# Patient Record
Sex: Male | Born: 1980 | Race: Black or African American | Hispanic: No | Marital: Single | Smoking: Current every day smoker
Health system: Southern US, Community
[De-identification: ages and names within clinical notes are randomized; demographics above are authoritative.]

## PROBLEM LIST (undated history)

## (undated) DIAGNOSIS — M25511 Pain in right shoulder: Secondary | ICD-10-CM

## (undated) DIAGNOSIS — F4310 Post-traumatic stress disorder, unspecified: Secondary | ICD-10-CM

## (undated) DIAGNOSIS — G10 Huntington's disease: Secondary | ICD-10-CM

## (undated) DIAGNOSIS — F32A Depression, unspecified: Secondary | ICD-10-CM

## (undated) DIAGNOSIS — G255 Other chorea: Secondary | ICD-10-CM

## (undated) HISTORY — DX: Depression, unspecified: F32.A

## (undated) HISTORY — PX: OTHER SURGICAL HISTORY: SHX169

## (undated) HISTORY — DX: Pain in right shoulder: M25.511

---

## 2006-08-13 ENCOUNTER — Emergency Department: Payer: Self-pay | Admitting: Emergency Medicine

## 2006-08-15 ENCOUNTER — Emergency Department: Payer: Self-pay | Admitting: Unknown Physician Specialty

## 2010-02-15 ENCOUNTER — Emergency Department: Payer: Self-pay | Admitting: Emergency Medicine

## 2012-08-21 ENCOUNTER — Emergency Department: Payer: Self-pay | Admitting: Emergency Medicine

## 2015-05-09 ENCOUNTER — Encounter: Payer: Self-pay | Admitting: Urgent Care

## 2015-05-09 ENCOUNTER — Emergency Department
Admission: EM | Admit: 2015-05-09 | Discharge: 2015-05-09 | Disposition: A | Discharge status: Home / Self Care | Payer: Self-pay | Attending: Emergency Medicine | Admitting: Emergency Medicine

## 2015-05-09 DIAGNOSIS — H109 Unspecified conjunctivitis: Secondary | ICD-10-CM | POA: Insufficient documentation

## 2015-05-09 DIAGNOSIS — F17200 Nicotine dependence, unspecified, uncomplicated: Secondary | ICD-10-CM | POA: Insufficient documentation

## 2015-05-09 MED ORDER — ERYTHROMYCIN 5 MG/GM OP OINT
TOPICAL_OINTMENT | Freq: Once | OPHTHALMIC | Status: AC
  Administered 2015-05-09: 1 via OPHTHALMIC
  Filled 2015-05-09: qty 1

## 2015-05-09 MED ORDER — LORATADINE 10 MG PO TABS: 10.0000 mg | ORAL_TABLET | Freq: Every day | ORAL | Status: DC

## 2015-05-09 MED ORDER — ERYTHROMYCIN 5 MG/GM OP OINT: TOPICAL_OINTMENT | Freq: Once | OPHTHALMIC | Status: DC

## 2015-05-09 MED ORDER — LORATADINE 10 MG PO TABS
10.0000 mg | ORAL_TABLET | Freq: Once | ORAL | Status: AC
  Administered 2015-05-09: 10 mg via ORAL
  Filled 2015-05-09: qty 1

## 2015-05-09 NOTE — ED Notes (Signed)
Patient presents with 3 weeks of eye swelling, "weeping", and pain. States, "It switches sides. Today it is the RIGHT worse than the other one".

## 2015-05-09 NOTE — ED Provider Notes (Signed)
[facility] Emergency Department Provider Note  ____________________________________________  Time seen: 3:00 AM  I have reviewed the triage vital signs and the nursing notes.   HISTORY  Chief Complaint Eye Problem      HPI Frank Fernandez is a 35 y.o. male presents with right eye drainage swelling and irritation. Patient states that he's had similar symptoms in his left eye and that it alternates back and forth over the past 3 weeks however today the right eye and its worse than usual. Patient denies any fever afebrile on presentation temperature 97.7. Patient does admits to "allergies". Patient does admit to eyelash matting on awakening in the morning    Past medical history "Allergies"  There are no active problems to display for this patient.   Past surgical history None  Current Outpatient Rx  Name  Route  Sig  Dispense  Refill  . erythromycin ophthalmic ointment   Right Eye   Place into the right eye once.   3.5 g   0   . loratadine (CLARITIN) 10 MG tablet   Oral   Take 1 tablet (10 mg total) by mouth daily.   30 tablet   0     Allergies No known drug allergies No family history on file.  Social History Social History  Substance Use Topics  . Smoking status: Current Every Day Smoker  . Smokeless tobacco: None  . Alcohol Use: No    Review of Systems  Constitutional: Negative for fever. Eyes: Negative for visual changes.Positive for right eye discharge and irritation/social ENT: Negative for sore throat. Cardiovascular: Negative for chest pain. Respiratory: Negative for shortness of breath. Gastrointestinal: Negative for abdominal pain, vomiting and diarrhea. Genitourinary: Negative for dysuria. Musculoskeletal: Negative for back pain. Skin: Negative for rash. Neurological: Negative for headaches, focal weakness or numbness.   10-point ROS otherwise  negative.  ____________________________________________   PHYSICAL EXAM:  VITAL SIGNS: ED Triage Vitals  Enc Vitals Group     BP 05/09/15 0121 133/85 mmHg     Pulse Rate 05/09/15 0121 62     Resp 05/09/15 0121 16     Temp 05/09/15 0121 97.7 F (36.5 C)     Temp Source 05/09/15 0121 Oral     SpO2 05/09/15 0121 100 %     Weight 05/09/15 0121 200 lb (90.719 kg)     Height 05/09/15 0121 5\' 11"  (1.803 m)     Head Cir --      Peak Flow --      Pain Score 05/09/15 0122 7     Pain Loc --      Pain Edu? --      Excl. in GC? --      Constitutional: Alert and oriented. Well appearing and in no distress. Eyes: ConjunctivaeInjection with erythema. PERRL. Normal extraocular movements. ENT   Head: Normocephalic and atraumatic.   Nose: No congestion/rhinnorhea.   Mouth/Throat: Mucous membranes are moist.   Neck: No stridor. Hematological/Lymphatic/Immunilogical: No cervical lymphadenopathy. Cardiovascular: Normal rate, regular rhythm. Normal and symmetric distal pulses are present in all extremities. No murmurs, rubs, or gallops. Respiratory: Normal respiratory effort without tachypnea nor retractions. Breath sounds are clear and equal bilaterally. No wheezes/rales/rhonchi. Gastrointestinal: Soft and nontender. No distention. There is no CVA tenderness. Genitourinary: deferred Musculoskeletal: Nontender with normal range of motion in all extremities. No joint effusions.  No lower extremity tenderness nor edema. Neurologic:  Normal speech and language. No gross focal neurologic deficits are appreciated. Speech is normal.  Skin:  Skin is warm, dry and intact. No rash noted. Psychiatric: Mood and affect are normal. Speech and behavior are normal. Patient exhibits appropriate insight and judgment.    INITIAL IMPRESSION / ASSESSMENT AND PLAN / ED COURSE  Pertinent labs & imaging results that were available during my care of the patient were reviewed by me and considered in my  medical decision making (see chart for details).  History of physical exam consistent with possible allergic conjunctivitis with superimposed bacterial conjunctivitis in the right eye. As such patient received erythromycin ophthalmic and Claritin.  ____________________________________________   FINAL CLINICAL IMPRESSION(S) / ED DIAGNOSES  Final diagnoses:  Conjunctivitis of right eye      Darci Current, MD 05/09/15 [phone]

## 2015-05-09 NOTE — Discharge Instructions (Signed)

## 2016-06-08 ENCOUNTER — Encounter: Payer: Self-pay | Admitting: *Deleted

## 2016-06-08 DIAGNOSIS — M545 Low back pain: Secondary | ICD-10-CM | POA: Diagnosis not present

## 2016-06-08 DIAGNOSIS — R1084 Generalized abdominal pain: Secondary | ICD-10-CM | POA: Diagnosis present

## 2016-06-08 DIAGNOSIS — F17200 Nicotine dependence, unspecified, uncomplicated: Secondary | ICD-10-CM | POA: Insufficient documentation

## 2016-06-08 DIAGNOSIS — Z79899 Other long term (current) drug therapy: Secondary | ICD-10-CM | POA: Insufficient documentation

## 2016-06-08 DIAGNOSIS — Y92410 Unspecified street and highway as the place of occurrence of the external cause: Secondary | ICD-10-CM | POA: Diagnosis not present

## 2016-06-08 DIAGNOSIS — M542 Cervicalgia: Secondary | ICD-10-CM | POA: Insufficient documentation
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
## 2016-06-09 ENCOUNTER — Emergency Department: Payer: No Typology Code available for payment source

## 2016-06-09 ENCOUNTER — Emergency Department
Admission: EM | Admit: 2016-06-09 | Discharge: 2016-06-09 | Disposition: A | Discharge status: Home / Self Care | Payer: No Typology Code available for payment source | Attending: Emergency Medicine | Admitting: Emergency Medicine

## 2016-06-09 DIAGNOSIS — M545 Low back pain, unspecified: Secondary | ICD-10-CM

## 2016-06-09 DIAGNOSIS — M7918 Myalgia, other site: Secondary | ICD-10-CM

## 2016-06-09 DIAGNOSIS — R1084 Generalized abdominal pain: Secondary | ICD-10-CM

## 2016-06-09 DIAGNOSIS — M542 Cervicalgia: Secondary | ICD-10-CM

## 2016-06-09 LAB — BASIC METABOLIC PANEL
ANION GAP: 6 (ref 5–15)
BUN: 27 mg/dL — ABNORMAL HIGH (ref 6–20)
CALCIUM: 8.8 mg/dL — AB (ref 8.9–10.3)
CO2: 31 mmol/L (ref 22–32)
Chloride: 107 mmol/L (ref 101–111)
Creatinine, Ser: 1.18 mg/dL (ref 0.61–1.24)
GLUCOSE: 90 mg/dL (ref 65–99)
POTASSIUM: 4.2 mmol/L (ref 3.5–5.1)
Sodium: 144 mmol/L (ref 135–145)

## 2016-06-09 LAB — CBC
HEMATOCRIT: 46.5 % (ref 40.0–52.0)
HEMOGLOBIN: 15.7 g/dL (ref 13.0–18.0)
MCH: 31.1 pg (ref 26.0–34.0)
MCHC: 33.6 g/dL (ref 32.0–36.0)
MCV: 92.3 fL (ref 80.0–100.0)
Platelets: 207 10*3/uL (ref 150–440)
RBC: 5.04 MIL/uL (ref 4.40–5.90)
RDW: 13.7 % (ref 11.5–14.5)
WBC: 10.3 10*3/uL (ref 3.8–10.6)

## 2016-06-09 MED ORDER — IBUPROFEN 600 MG PO TABS
600.0000 mg | ORAL_TABLET | Freq: Once | ORAL | Status: AC
  Administered 2016-06-09: 600 mg via ORAL
  Filled 2016-06-09: qty 1

## 2016-06-09 MED ORDER — IOPAMIDOL (ISOVUE-300) INJECTION 61%
100.0000 mL | Freq: Once | INTRAVENOUS | Status: AC | PRN
  Administered 2016-06-09: 100 mL via INTRAVENOUS

## 2016-06-09 MED ORDER — TRAMADOL HCL 50 MG PO TABS
50.0000 mg | ORAL_TABLET | Freq: Once | ORAL | Status: AC
  Administered 2016-06-09: 50 mg via ORAL
  Filled 2016-06-09: qty 1

## 2016-06-09 MED ORDER — TRAMADOL HCL 50 MG PO TABS: 50.0000 mg | ORAL_TABLET | Freq: Four times a day (QID) | ORAL | 0 refills | Status: DC | PRN

## 2016-06-09 MED ORDER — ETODOLAC 200 MG PO CAPS: 200.0000 mg | ORAL_CAPSULE | Freq: Three times a day (TID) | ORAL | 0 refills | Status: DC

## 2016-06-09 NOTE — ED Notes (Signed)
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
## 2016-06-09 NOTE — ED Notes (Signed)
No changes in condition, pt resting in chair

## 2016-06-09 NOTE — Discharge Instructions (Signed)
Please follow up

## 2016-08-24 ENCOUNTER — Encounter: Payer: Self-pay | Admitting: Emergency Medicine

## 2016-08-24 ENCOUNTER — Emergency Department
Admission: EM | Admit: 2016-08-24 | Discharge: 2016-08-24 | Disposition: A | Discharge status: Home / Self Care | Payer: Self-pay | Attending: Emergency Medicine | Admitting: Emergency Medicine

## 2016-08-24 DIAGNOSIS — Y998 Other external cause status: Secondary | ICD-10-CM | POA: Insufficient documentation

## 2016-08-24 DIAGNOSIS — S91115A Laceration without foreign body of left lesser toe(s) without damage to nail, initial encounter: Secondary | ICD-10-CM

## 2016-08-24 DIAGNOSIS — F17200 Nicotine dependence, unspecified, uncomplicated: Secondary | ICD-10-CM | POA: Insufficient documentation

## 2016-08-24 DIAGNOSIS — W231XXA Caught, crushed, jammed, or pinched between stationary objects, initial encounter: Secondary | ICD-10-CM | POA: Insufficient documentation

## 2016-08-24 DIAGNOSIS — Y9301 Activity, walking, marching and hiking: Secondary | ICD-10-CM | POA: Insufficient documentation

## 2016-08-24 DIAGNOSIS — Y929 Unspecified place or not applicable: Secondary | ICD-10-CM | POA: Insufficient documentation

## 2016-08-24 DIAGNOSIS — S91215A Laceration without foreign body of left lesser toe(s) with damage to nail, initial encounter: Secondary | ICD-10-CM | POA: Insufficient documentation

## 2016-08-24 MED ORDER — LIDOCAINE HCL (PF) 1 % IJ SOLN
5.0000 mL | Freq: Once | INTRAMUSCULAR | Status: AC
  Administered 2016-08-24: 5 mL
  Filled 2016-08-24: qty 5

## 2016-08-24 NOTE — ED Notes (Signed)
See triage note   States he caught his left 5 th toe on the carpet this am  Laceration noted to toe

## 2016-08-24 NOTE — ED Triage Notes (Signed)
Pt with left fifth toe laceration this am.

## 2016-08-24 NOTE — ED Provider Notes (Signed)
[facility] Emergency Department Provider Note   ____________________________________________   First MD Initiated Contact with Patient 08/24/16 0818     (approximate)  I have reviewed the triage vital signs and the nursing notes.   HISTORY  Chief Complaint Laceration   HPI Frank Fernandez is a 36 y.o. male is here with laceration to his left fifth toe that happened this morning. Patient states that he caught his toe nail in the carpet. He denies any foreign body in the carpet. He states that he has had a tetanus booster within the last 5 years. He rates his pain as an 8 out of 10.   History reviewed. No pertinent past medical history.  There are no active problems to display for this patient.   History reviewed. No pertinent surgical history.  Prior to Admission medications   Not on File    Allergies Patient has no known allergies.  No family history on file.  Social History Social History  Substance Use Topics  . Smoking status: Current Every Day Smoker  . Smokeless tobacco: Never Used  . Alcohol use No    Review of Systems Constitutional: No fever/chills Cardiovascular: Denies chest pain. Respiratory: Denies shortness of breath. Musculoskeletal: Negative for back pain. Skin: Positive for laceration left fifth toe. Neurological: Negative for  focal weakness or numbness.   ____________________________________________   PHYSICAL EXAM:  VITAL SIGNS: ED Triage Vitals  Enc Vitals Group     BP 08/24/16 0809 126/69     Pulse Rate 08/24/16 0809 77     Resp 08/24/16 0809 20     Temp 08/24/16 0809 97.6 F (36.4 C)     Temp Source 08/24/16 0809 Oral     SpO2 08/24/16 0809 99 %     Weight 08/24/16 0807 195 lb (88.5 kg)     Height --      Head Circumference --      Peak Flow --      Pain Score 08/24/16 0807 8     Pain Loc --      Pain Edu? --      Excl. in GC? --     Constitutional: Alert and oriented. Well appearing and  in no acute distress. Eyes: Conjunctivae are normal.  Head: Atraumatic. Nose: No congestion/rhinnorhea. Neck: No stridor.   Cardiovascular: Normal rate, regular rhythm. Grossly normal heart sounds.  Good peripheral circulation. Respiratory: Normal respiratory effort.  No retractions. Lungs CTAB. Musculoskeletal: On examination of the left fifth toe there is no gross deformity. Motor sensory function intact. There is a laceration present on the medial aspect of the fifth toe extending from the base of the toenail to the medial aspect. Laceration is superficial. No foreign bodies were noted. Minimal bleeding present. Neurologic:  Normal speech and language. No gross focal neurologic deficits are appreciated.  Skin:  Skin is warm, dry. Psychiatric: Mood and affect are normal. Speech and behavior are normal.  ____________________________________________   LABS (all labs ordered are listed, but only abnormal results are displayed)  Labs Reviewed - No data to display  PROCEDURES  Procedure(s) performed: LACERATION REPAIR Performed by: Tommi Rumpshonda L Kemal Amores Authorized by: Tommi Rumpshonda L Tyran Huser Consent: Verbal consent obtained. Risks and benefits: risks, benefits and alternatives were discussed Consent given by: patient Patient identity confirmed: provided demographic data Prepped and Draped in normal sterile fashion Wound explored  Laceration Location: Left fifth toe.  Laceration Length: 1.5 cm  No Foreign Bodies seen or palpated  Anesthesia: Digital block  Local anesthetic: lidocaine 1 % without epinephrine  Anesthetic total: 3 ml  Irrigation method: syringe Amount of cleaning: standard  Skin closure: 4-0 Ethilon   Number of sutures: 6   Technique: Simple interrupted   Patient tolerance: Patient tolerated the procedure well with no immediate complications.  Procedures  Critical Care performed: No  ____________________________________________   INITIAL IMPRESSION /  ASSESSMENT AND PLAN / ED COURSE  Pertinent labs & imaging results that were available during my care of the patient were reviewed by me and considered in my medical decision making (see chart for details).  Patient was given information to keep area clean and dry. He is to watch for any signs of infection and clean with mild soap and water daily. He is to take Tylenol or ibuprofen as needed for pain. He can follow up with his PCP or urgent care for suture removal in 10 days.    ___________________________________________   FINAL CLINICAL IMPRESSION(S) / ED DIAGNOSES  Final diagnoses:  Laceration of fifth toe of left foot, initial encounter      NEW MEDICATIONS STARTED DURING THIS VISIT:  Discharge Medication List as of 08/24/2016  8:55 AM       Note:  This document was prepared using Dragon voice recognition software and may include unintentional dictation errors.    Tommi RumpsSummers, Paislee Szatkowski L, PA-C 08/24/16 1123    Jeanmarie PlantMcShane, James A, MD 08/24/16 [phone]

## 2016-08-24 NOTE — Discharge Instructions (Signed)
Keep area clean and dry. Clean daily with mild soap and water and dried completely. Tylenol or ibuprofen as needed for pain. Sutures to be removed at an urgent care or your primary care doctor in 10 days.

## 2016-08-24 NOTE — ED Notes (Signed)
First Nurse Note:  Ice in glove to left small toe.

## 2017-04-24 ENCOUNTER — Emergency Department
Admission: EM | Admit: 2017-04-24 | Discharge: 2017-04-24 | Payer: Self-pay | Attending: Emergency Medicine | Admitting: Emergency Medicine

## 2017-04-24 ENCOUNTER — Emergency Department: Payer: Self-pay

## 2017-04-24 ENCOUNTER — Other Ambulatory Visit: Payer: Self-pay

## 2017-04-24 DIAGNOSIS — T148XXA Other injury of unspecified body region, initial encounter: Secondary | ICD-10-CM | POA: Insufficient documentation

## 2017-04-24 DIAGNOSIS — Z23 Encounter for immunization: Secondary | ICD-10-CM | POA: Insufficient documentation

## 2017-04-24 DIAGNOSIS — Y929 Unspecified place or not applicable: Secondary | ICD-10-CM | POA: Insufficient documentation

## 2017-04-24 DIAGNOSIS — F17200 Nicotine dependence, unspecified, uncomplicated: Secondary | ICD-10-CM | POA: Insufficient documentation

## 2017-04-24 DIAGNOSIS — Z008 Encounter for other general examination: Secondary | ICD-10-CM | POA: Insufficient documentation

## 2017-04-24 DIAGNOSIS — Y9389 Activity, other specified: Secondary | ICD-10-CM | POA: Insufficient documentation

## 2017-04-24 DIAGNOSIS — Y999 Unspecified external cause status: Secondary | ICD-10-CM | POA: Insufficient documentation

## 2017-04-24 DIAGNOSIS — S6982XA Other specified injuries of left wrist, hand and finger(s), initial encounter: Secondary | ICD-10-CM | POA: Insufficient documentation

## 2017-04-24 MED ORDER — IBUPROFEN 600 MG PO TABS
600.0000 mg | ORAL_TABLET | Freq: Once | ORAL | Status: AC
  Administered 2017-04-24: 600 mg via ORAL
  Filled 2017-04-24: qty 1

## 2017-04-24 MED ORDER — HYDROCODONE-ACETAMINOPHEN 5-325 MG PO TABS
1.0000 | ORAL_TABLET | Freq: Once | ORAL | Status: AC
  Administered 2017-04-24: 1 via ORAL
  Filled 2017-04-24: qty 1

## 2017-04-24 MED ORDER — TETANUS-DIPHTH-ACELL PERTUSSIS 5-2.5-18.5 LF-MCG/0.5 IM SUSP
0.5000 mL | Freq: Once | INTRAMUSCULAR | Status: AC
  Administered 2017-04-24: 0.5 mL via INTRAMUSCULAR
  Filled 2017-04-24: qty 0.5
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
## 2017-04-24 NOTE — Discharge Instructions (Signed)
FORTUNATELY TODAY YOUR XRAYS SHOWED NO FRACTURE AND YOU ARE MEDICALLY STABLE FOR BOOKING
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
## 2018-04-28 ENCOUNTER — Emergency Department
Admission: EM | Admit: 2018-04-28 | Discharge: 2018-04-28 | Disposition: A | Discharge status: Home / Self Care | Payer: Self-pay | Attending: Emergency Medicine | Admitting: Emergency Medicine

## 2018-04-28 ENCOUNTER — Encounter: Payer: Self-pay | Admitting: Emergency Medicine

## 2018-04-28 ENCOUNTER — Other Ambulatory Visit: Payer: Self-pay

## 2018-04-28 DIAGNOSIS — K047 Periapical abscess without sinus: Secondary | ICD-10-CM

## 2018-04-28 DIAGNOSIS — F17210 Nicotine dependence, cigarettes, uncomplicated: Secondary | ICD-10-CM | POA: Insufficient documentation

## 2018-04-28 DIAGNOSIS — K0889 Other specified disorders of teeth and supporting structures: Secondary | ICD-10-CM | POA: Insufficient documentation

## 2018-04-28 MED ORDER — IBUPROFEN 600 MG PO TABS: 600.0000 mg | ORAL_TABLET | Freq: Four times a day (QID) | ORAL | 0 refills | Status: DC | PRN

## 2018-04-28 MED ORDER — CEFTRIAXONE SODIUM 1 G IJ SOLR
1.0000 g | Freq: Once | INTRAMUSCULAR | Status: AC
Start: 2018-04-28 — End: 2018-04-28
  Administered 2018-04-28: 1 g via INTRAMUSCULAR
  Filled 2018-04-28: qty 10

## 2018-04-28 MED ORDER — AMOXICILLIN 500 MG PO CAPS
500.0000 mg | ORAL_CAPSULE | Freq: Three times a day (TID) | ORAL | 0 refills | Status: DC
Start: 2018-04-28 — End: 2019-02-06

## 2018-04-28 MED ORDER — LIDOCAINE VISCOUS HCL 2 % MT SOLN
15.0000 mL | Freq: Once | OROMUCOSAL | Status: AC
  Administered 2018-04-28: 10:00:00 15 mL via OROMUCOSAL
  Filled 2018-04-28: qty 15

## 2018-04-28 MED ORDER — LIDOCAINE VISCOUS HCL 2 % MT SOLN: 10.0000 mL | OROMUCOSAL | 0 refills | Status: DC | PRN

## 2018-04-28 NOTE — Discharge Instructions (Addendum)
OPTIONS FOR DENTAL FOLLOW UP CARE ° °[place] Department of Health and Human Services - Local Safety Net Dental Clinics °http://www.ncdhhs.gov/dph/oralhealth/services/safetynetclinics.htm °  °[facility] (336-562-3123) ° °[facility] [place] ([phone]) ° °[facility] ([phone] ext [phone]) ° °[facility] ([phone]) ° °[facility] ([phone]) °This clinic caters to the indigent population and is on a lottery system. °Location: °[facility], [address], [place] °Clinic Hours: °Wednesdays from 6pm - 9pm, patients seen by a lottery system. °For dates, call or go to [email or web address]/[facility]/patients/[email or web address] °Services: °Cleanings, fillings and simple extractions. °Payment Options: °DENTAL WORK IS FREE OF CHARGE. Bring proof of income or support. °Best way to get seen: °Arrive at 5:15 pm - this is a lottery, NOT first come/first serve, so arriving earlier will not increase your chances of being seen. °  °  °[facility] °[phone] °Select option 1 for emergencies °  °Location: °[facility], [address], [place] °Clinic Hours: °No walk-ins accepted - call the day before to schedule an appointment. °Check in times are 9:30 am and 1:30 pm. °Services: °Simple extractions, temporary fillings, pulpectomy/pulp debridement, uncomplicated abscess drainage. °Payment Options: °PAYMENT IS DUE AT THE TIME OF SERVICE.  Fee is usually $100-200, additional surgical procedures (e.g. abscess drainage) may be extra. °Cash, checks, Visa/MasterCard accepted.  Can file Medicaid if patient is covered for dental - patient should call case worker to check. °No discount for [facility] patients. °Best way to get seen: °MUST call the day before and get onto the schedule. Can usually be seen the next 1-2 days. No walk-ins accepted. °  °  °[facility] °[phone] °   °Location: °[facility], [address], [place] °Clinic Hours: °M, W, Th, F 8am or 1:30pm, Tues 9a or 1:30 - first come/first served. °Services: °Simple extractions, temporary fillings, uncomplicated abscess drainage.  You do not need to be an [place] resident. °Payment Options: °PAYMENT IS DUE AT THE TIME OF SERVICE. °Dental insurance, otherwise sliding scale - bring proof of income or support. °Depending on income and treatment needed, cost is usually $50-200. °Best way to get seen: °Arrive early as it is first come/first served. °  °  °[facility] °[phone] °  °Location: °[address] °Clinic Hours: °Mon-Thu 8a-5p °Services: °Most basic dental services including extractions and fillings. °Payment Options: °PAYMENT IS DUE AT THE TIME OF SERVICE. °Sliding scale, up to 50% off - bring proof if income or support. °Medicaid with dental option accepted. °Best way to get seen: °Call to schedule an appointment, can usually be seen within 2 weeks OR they will try to see walk-ins - show up at 8a or 2p (you may have to wait). °  °  °[facility] °[phone] °[place] RESIDENTS ONLY °  °Location: °[facility], [address], [place], [place] [address] °Clinic Hours: By appointment only. °Monday - Thursday 8am-5pm, Friday 8am-12pm °Services: Cleanings, fillings, extractions. °Payment Options: °PAYMENT IS DUE AT THE TIME OF SERVICE. °Cash, Visa or MasterCard. Sliding scale - $30 minimum per service. °Best way to get seen: °Come in to office, complete packet and make an appointment - need proof of income °or support monies for each household member and proof of [place] residence. °Usually takes about a month to get in. °  °  °[facility] °[phone] °  °Location: °[address]   [place] °Clinic Hours: Walk-in Urgent Care Dental Services are offered Monday-Friday  mornings only. °The numbers of emergencies accepted daily is limited to the number of °providers available. °Maximum 15 - Mondays, Wednesdays & Thursdays °Maximum 10 - Tuesdays & Fridays °Services: °You do not need to be a [place] resident to be seen for a dental emergency. °Emergencies are defined as pain, swelling, abnormal bleeding, or dental trauma. Walkins will receive x-rays if needed. °NOTE: Dental cleaning is not an emergency. °Payment Options: °PAYMENT IS DUE AT THE TIME OF SERVICE. °Minimum co-pay is $40.00 for uninsured patients. °Minimum co-pay is $3.00 for Medicaid with dental coverage. °Dental Insurance is accepted and must be presented at time of visit. °Medicare does not cover dental. °Forms of payment: Cash, credit card, checks. °Best way to get seen: °If not previously registered with the clinic, walk-in dental registration begins at 7:15 am and is on a first come/first serve basis. °If previously registered with the clinic, call to make an appointment. °  °  °[facility] °[phone] °[place] RESIDENTS ONLY °  °Location: °[address], [place], [place] °Clinic Hours: °Mon-Thu 10a-2p °Services: Extractions only! °Payment Options: °FREE (donations accepted) - bring proof of income or support °Best way to get seen: °Call and schedule an appointment OR come at 8am on the 1st Monday of every month (except for holidays) when it is first come/first served. °  °  °[facility] °[phone] °  °Location: °[address], [place] °Clinic Hours: °Friday mornings °Services, Payment Options, Best way to get seen: °Call for info °

## 2018-04-28 NOTE — ED Notes (Signed)
See triage note  Presents with pain and swelling to left jaw line  Denies any injury yo area  And states he has not broken a tooth

## 2018-04-28 NOTE — ED Triage Notes (Signed)
Pt has right jaw swelling and dental pain.  Temp 102 per pt this AM, has not taken motrin/tylenol.  No cough, no travel.

## 2018-04-28 NOTE — ED Provider Notes (Signed)
[facility] Emergency Department Provider Note  ____________________________________________  Time seen: Approximately 9:44 AM  I have reviewed the triage vital signs and the nursing notes.   HISTORY  Chief Complaint Dental Problem    HPI Frank Fernandez is a 38 y.o. male that presents to the emergency department for evaluation of left-sided dental pain for 3 days.  Patient states that area started swelling yesterday.  He had a temperature of 102 this morning.  He did not take any Tylenol or Motrin.  No sick contacts.  No recent travel.  He does not have a dentist.   History reviewed. No pertinent past medical history.  There are no active problems to display for this patient.   History reviewed. No pertinent surgical history.  Prior to Admission medications   Medication Sig Start Date End Date Taking? Authorizing Provider  amoxicillin (AMOXIL) 500 MG capsule Take 1 capsule (500 mg total) by mouth 3 (three) times daily. 04/28/18   [name] Derry, PA-C  ibuprofen (ADVIL,MOTRIN) 600 MG tablet Take 1 tablet (600 mg total) by mouth every 6 (six) hours as needed. 04/28/18   [name] Derry, PA-C  lidocaine (XYLOCAINE) 2 % solution Use as directed 10 mLs in the mouth or throat as needed. 04/28/18   [name] Derry, PA-C    Allergies Patient has no known allergies.  History reviewed. No pertinent family history.  Social History Social History   Tobacco Use  . Smoking status: Current Every Day Smoker  . Smokeless tobacco: Never Used  Substance Use Topics  . Alcohol use: No  . Drug use: No     Review of Systems  Constitutional: Positive for fever. Respiratory: No SOB. Gastrointestinal: No abdominal pain.  No nausea, no vomiting.  Musculoskeletal: Negative for musculoskeletal pain. Skin: Negative for rash, abrasions, lacerations, ecchymosis. Neurological: Negative for headaches, numbness or  tingling   ____________________________________________   PHYSICAL EXAM:  VITAL SIGNS: ED Triage Vitals  Enc Vitals Group     BP 04/28/18 0934 125/74     Pulse Rate 04/28/18 0934 89     Resp 04/28/18 0934 16     Temp 04/28/18 0934 98.2 F (36.8 C)     Temp Source 04/28/18 0934 Oral     SpO2 04/28/18 0934 98 %     Weight 04/28/18 0931 185 lb (83.9 kg)     Height 04/28/18 0931 5\' 11"  (1.803 m)     Head Circumference --      Peak Flow --      Pain Score 04/28/18 0931 9     Pain Loc --      Pain Edu? --      Excl. in [place]? --      Constitutional: Alert and oriented. Well appearing and in no acute distress. Eyes: Conjunctivae are normal. PERRL. EOMI. Head: Atraumatic. ENT:      Ears:      Nose: No congestion/rhinnorhea.      Mouth/Throat: Mucous membranes are moist.  Large cavity decayed to gumline to tooth #19.  Mild swelling to left cheek.  Full range of motion of jaw. Neck: No stridor.  Cardiovascular: Normal rate, regular rhythm.  Good peripheral circulation. Respiratory: Normal respiratory effort without tachypnea or retractions. Lungs CTAB. Good air entry to the bases with no decreased or absent breath sounds. Musculoskeletal: Full range of motion to all extremities. No gross deformities appreciated. Neurologic:  Normal speech and language. No gross focal neurologic deficits are appreciated.  Skin:  Skin is warm, dry  and intact. No rash noted. Psychiatric: Mood and affect are normal. Speech and behavior are normal. Patient exhibits appropriate insight and judgement.   ____________________________________________   LABS (all labs ordered are listed, but only abnormal results are displayed)  Labs Reviewed - No data to display ____________________________________________  EKG   ____________________________________________  RADIOLOGY   No results found.  ____________________________________________    PROCEDURES  Procedure(s) performed:     Procedures    Medications  cefTRIAXone (ROCEPHIN) injection 1 g (1 g Intramuscular Given 04/28/18 0953)  lidocaine (XYLOCAINE) 2 % viscous mouth solution 15 mL (15 mLs Mouth/Throat Given 04/28/18 0953)     ____________________________________________   INITIAL IMPRESSION / ASSESSMENT AND PLAN / ED COURSE  Pertinent labs & imaging results that were available during my care of the patient were reviewed by me and considered in my medical decision making (see chart for details).  Review of the [place] CSRS was performed in accordance of the [facility] prior to dispensing any controlled drugs.     Patient's diagnosis is consistent with dental abscess.  Vital signs and exam are reassuring.  IM ceftriaxone was given.  Patient will be discharged home with prescriptions for amoxicillin, viscous lidocaine, ibuprofen. Patient is to follow up with dentist as directed.  Dental resources were given.  Patient is given ED precautions to return to the ED for any worsening or new symptoms.     ____________________________________________  FINAL CLINICAL IMPRESSION(S) / ED DIAGNOSES  Final diagnoses:  Dental abscess      NEW MEDICATIONS STARTED DURING THIS VISIT:  ED Discharge Orders         Ordered    amoxicillin (AMOXIL) 500 MG capsule  3 times daily     04/28/18 0948    lidocaine (XYLOCAINE) 2 % solution  As needed     04/28/18 0948    ibuprofen (ADVIL,MOTRIN) 600 MG tablet  Every 6 hours PRN     04/28/18 0948              This chart was dictated using voice recognition software/Dragon. Despite best efforts to proofread, errors can occur which can change the meaning. Any change was purely unintentional.    [name] Derry, PA-C 04/28/18 1706    Don Perking, [place], MD 04/29/18 [phone]

## 2019-02-06 ENCOUNTER — Other Ambulatory Visit: Payer: Self-pay

## 2019-02-06 ENCOUNTER — Emergency Department
Admission: EM | Admit: 2019-02-06 | Discharge: 2019-02-06 | Disposition: A | Discharge status: Home / Self Care | Payer: HRSA Program | Attending: Emergency Medicine | Admitting: Emergency Medicine

## 2019-02-06 DIAGNOSIS — R05 Cough: Secondary | ICD-10-CM | POA: Insufficient documentation

## 2019-02-06 DIAGNOSIS — Z20822 Contact with and (suspected) exposure to covid-19: Secondary | ICD-10-CM | POA: Diagnosis not present

## 2019-02-06 DIAGNOSIS — R059 Cough, unspecified: Secondary | ICD-10-CM

## 2019-02-06 DIAGNOSIS — F17210 Nicotine dependence, cigarettes, uncomplicated: Secondary | ICD-10-CM | POA: Diagnosis not present

## 2019-02-06 LAB — SARS CORONAVIRUS 2 (TAT 6-24 HRS): SARS Coronavirus 2: NEGATIVE

## 2019-02-06 NOTE — ED Triage Notes (Signed)
Pt states "im aroudn infants and I just need a paper saying I dont have covid".

## 2019-02-06 NOTE — ED Notes (Addendum)
Pt with cough x 2 months. states he needs covid test. Lungs clear. NAD

## 2019-02-06 NOTE — ED Provider Notes (Signed)
[facility] Emergency Department Provider Note  ____________________________________________   First MD Initiated Contact with Patient 02/06/19 1408     (approximate)  I have reviewed the triage vital signs and the nursing notes.   HISTORY  Chief Complaint URI   HPI Frank Fernandez is a 39 y.o. male presents to the ED for a Covid test.  Patient states he has had a nonproductive cough for 2 months with no fever, chills, nausea or vomiting.  He has had no difficulty breathing.  States that he just needs a note saying he does not have Covid because he is around infants.  He denies any change in taste or smell.  Patient continue to smoke 5 to 7 cigarettes/day.  He rates his pain a 0/10.      History reviewed. No pertinent past medical history.  There are no problems to display for this patient.   History reviewed. No pertinent surgical history.  Prior to Admission medications   Not on File    Allergies Patient has no known allergies.  No family history on file.  Social History Social History   Tobacco Use  . Smoking status: Current Every Day Smoker  . Smokeless tobacco: Never Used  Substance Use Topics  . Alcohol use: No  . Drug use: No    Review of Systems Constitutional: No fever/chills Eyes: No visual changes. ENT: No sore throat. Cardiovascular: Denies chest pain. Respiratory: Denies shortness of breath.  Positive for cough. Gastrointestinal: No abdominal pain.  No nausea, no vomiting.  No diarrhea.  Musculoskeletal: Negative for muscle aches. Skin: Negative for rash. Neurological: Negative for headaches, focal weakness or numbness. ____________________________________________   PHYSICAL EXAM:  VITAL SIGNS: ED Triage Vitals  Enc Vitals Group     BP 02/06/19 1327 (!) 126/95     Pulse Rate 02/06/19 1327 81     Resp 02/06/19 1327 20     Temp 02/06/19 1327 98.7 F (37.1 C)     Temp Source 02/06/19 1327 Oral     SpO2  02/06/19 1327 99 %     Weight 02/06/19 1328 181 lb (82.1 kg)     Height 02/06/19 1328 6' (1.829 m)     Head Circumference --      Peak Flow --      Pain Score 02/06/19 1336 0     Pain Loc --      Pain Edu? --      Excl. in [place]? --     Constitutional: Alert and oriented. Well appearing and in no acute distress. Eyes: Conjunctivae are normal.  Head: Atraumatic. Neck: No stridor.   Cardiovascular: Normal rate, regular rhythm. Grossly normal heart sounds.  Good peripheral circulation. Respiratory: Normal respiratory effort.  No retractions. Lungs CTAB. Gastrointestinal: Soft and nontender. No distention. No abdominal bruits. No CVA tenderness. Musculoskeletal: Moves upper and lower extremities any difficulty.  Normal gait was noted. Neurologic:  Normal speech and language. No gross focal neurologic deficits are appreciated. No gait instability. Skin:  Skin is warm, dry and intact.  Psychiatric: Mood and affect are normal. Speech and behavior are normal.  ____________________________________________   LABS (all labs ordered are listed, but only abnormal results are displayed)  Labs Reviewed  SARS CORONAVIRUS 2 (TAT 6-24 HRS)    PROCEDURES  Procedure(s) performed (including Critical Care):  Procedures ____________________________________________   INITIAL IMPRESSION / ASSESSMENT AND PLAN / ED COURSE  As part of my medical decision making, I reviewed the following data within  the electronic MEDICAL RECORD NUMBER Notes from prior ED visits and  Controlled Substance Database  Frank Fernandez was evaluated in Emergency Department on 02/06/2019 for the symptoms described in the history of present illness. He was evaluated in the context of the global COVID-19 pandemic, which necessitated consideration that the patient might be at risk for infection with the SARS-CoV-2 virus that causes COVID-19. Institutional protocols and algorithms that pertain to the evaluation of patients at risk for  COVID-19 are in a state of rapid change based on information released by regulatory bodies including the CDC and federal and state organizations. These policies and algorithms were followed during the patient's care in the ED.  39 year old male presents to the ED for a Covid test.  He states that his symptoms have been going on for 2 months but he denies any fever, chills, nausea or vomiting.  He continues to eat without restrictions.  Patient states he needs paper stating that he does not have Covid so that he can be around infants.  Patient is aware that the results will not be available for 24 hours.   ____________________________________________   FINAL CLINICAL IMPRESSION(S) / ED DIAGNOSES  Final diagnoses:  Cough  Cigarette smoker     ED Discharge Orders    None       Note:  This document was prepared using Dragon voice recognition software and may include unintentional dictation errors.    Tommi Rumps, PA-C 02/06/19 1511    Chesley Noon, MD 02/07/19 1549

## 2019-02-06 NOTE — Discharge Instructions (Signed)
Follow-up with your primary care provider or can no clinic acute care if any continued problems.  Increase fluids.  Decrease the amount of smoking or quit if at all possible.  You may take Tylenol if needed.  The results of your Covid test should be available to you in 24 hours.  Your oxygen level at this time is 99% and you do not have a fever.

## 2019-02-06 NOTE — ED Notes (Signed)
Pt c/o weakness and cough. This tech informed pt that I would do an EKG and blood work d/t feeling weak. Pt stated "I just want to be tested for COVID. I just need paperwork saying that I an negative." Explained to pt that if he is tested here that it would not result today. Pt continues to ask if he will still get paperwork today saying he is negative.

## 2019-09-10 ENCOUNTER — Ambulatory Visit: Payer: Medicaid Other | Attending: Internal Medicine

## 2019-09-10 DIAGNOSIS — Z23 Encounter for immunization: Secondary | ICD-10-CM

## 2019-09-10 NOTE — Progress Notes (Signed)
   Covid-19 Vaccination Clinic  Name:  Frank Fernandez    MRN: [id number] DOB: 04/30/1980  09/10/2019  Frank Fernandez was observed post Covid-19 immunization for 15 minutes without incident. He was provided with Vaccine Information Sheet and instruction to access the V-Safe system.   Frank Fernandez was instructed to call 911 with any severe reactions post vaccine: Marland Kitchen Difficulty breathing  . Swelling of face and throat  . A fast heartbeat  . A bad rash all over body  . Dizziness and weakness   Immunizations Administered    Name Date Dose VIS Date Route   Pfizer COVID-19 Vaccine 09/10/2019  2:57 PM 0.3 mL 03/21/2018 Intramuscular   Manufacturer: [employer], [employer]   Lot: [id number]   NDC: [address]-9

## 2019-10-08 ENCOUNTER — Ambulatory Visit: Payer: Self-pay

## 2019-11-28 ENCOUNTER — Emergency Department: Payer: Medicaid Other

## 2019-11-28 ENCOUNTER — Other Ambulatory Visit: Payer: Self-pay

## 2019-11-28 ENCOUNTER — Emergency Department
Admission: EM | Admit: 2019-11-28 | Discharge: 2019-11-28 | Disposition: A | Discharge status: Home / Self Care | Payer: Medicaid Other | Attending: Emergency Medicine | Admitting: Emergency Medicine

## 2019-11-28 DIAGNOSIS — F17200 Nicotine dependence, unspecified, uncomplicated: Secondary | ICD-10-CM | POA: Insufficient documentation

## 2019-11-28 DIAGNOSIS — M79661 Pain in right lower leg: Secondary | ICD-10-CM | POA: Insufficient documentation

## 2019-11-28 DIAGNOSIS — M79605 Pain in left leg: Secondary | ICD-10-CM

## 2019-11-28 DIAGNOSIS — M79662 Pain in left lower leg: Secondary | ICD-10-CM | POA: Insufficient documentation

## 2019-11-28 LAB — BASIC METABOLIC PANEL
Anion gap: 8 (ref 5–15)
BUN: 23 mg/dL — ABNORMAL HIGH (ref 6–20)
CO2: 25 mmol/L (ref 22–32)
Calcium: 8.3 mg/dL — ABNORMAL LOW (ref 8.9–10.3)
Chloride: 106 mmol/L (ref 98–111)
Creatinine, Ser: 0.99 mg/dL (ref 0.61–1.24)
GFR, Estimated: >60 mL/min (ref >60)
Glucose, Bld: 100 mg/dL — ABNORMAL HIGH (ref 70–99)
Potassium: 4 mmol/L (ref 3.5–5.1)
Sodium: 139 mmol/L (ref 135–145)

## 2019-11-28 LAB — CBC
HCT: 41 % (ref 39.0–52.0)
Hemoglobin: 13.6 g/dL (ref 13.0–17.0)
MCH: 31.1 pg (ref 26.0–34.0)
MCHC: 33.2 g/dL (ref 30.0–36.0)
MCV: 93.8 fL (ref 80.0–100.0)
Platelets: 214 10*3/uL (ref 150–400)
RBC: 4.37 MIL/uL (ref 4.22–5.81)
RDW: 13.2 % (ref 11.5–15.5)
WBC: 9.2 10*3/uL (ref 4.0–10.5)
nRBC: 0 % (ref 0.0–0.2)

## 2019-11-28 MED ORDER — IBUPROFEN 600 MG PO TABS
600.0000 mg | ORAL_TABLET | Freq: Once | ORAL | Status: AC
  Administered 2019-11-28: 600 mg via ORAL
  Filled 2019-11-28: qty 1
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
## 2019-11-28 NOTE — Discharge Instructions (Addendum)
Please seek medical attention for any high fevers, chest pain, shortness of breath, change in behavior, persistent vomiting, bloody stool or any other new or concerning symptoms.  

## 2019-11-28 NOTE — ED Triage Notes (Signed)
Pt comes pov with very broken, jittery speech, and fast, quirky movements. Pt states that his feet have been swollen have "have knots". Very minor swelling noted to pt's legs.
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
## 2019-11-28 NOTE — ED Notes (Signed)
md with pt

## 2019-12-27 ENCOUNTER — Telehealth: Payer: Self-pay | Admitting: Gerontology

## 2019-12-27 NOTE — Telephone Encounter (Signed)
Called pt regarding eligibility documents. No answer LVM

## 2020-01-29 ENCOUNTER — Other Ambulatory Visit: Payer: Self-pay

## 2020-01-29 ENCOUNTER — Emergency Department
Admission: EM | Admit: 2020-01-29 | Discharge: 2020-01-30 | Disposition: A | Discharge status: Home / Self Care | Payer: HRSA Program | Attending: Emergency Medicine | Admitting: Emergency Medicine

## 2020-01-29 DIAGNOSIS — R110 Nausea: Secondary | ICD-10-CM | POA: Insufficient documentation

## 2020-01-29 DIAGNOSIS — F17200 Nicotine dependence, unspecified, uncomplicated: Secondary | ICD-10-CM | POA: Diagnosis not present

## 2020-01-29 DIAGNOSIS — U071 COVID-19: Secondary | ICD-10-CM | POA: Diagnosis not present

## 2020-01-29 DIAGNOSIS — R0602 Shortness of breath: Secondary | ICD-10-CM | POA: Diagnosis present

## 2020-01-29 MED ORDER — ONDANSETRON HCL 4 MG PO TABS: 4.0000 mg | ORAL_TABLET | Freq: Three times a day (TID) | ORAL | 1 refills | Status: AC | PRN

## 2020-01-29 MED ORDER — ONDANSETRON 4 MG PO TBDP
4.0000 mg | ORAL_TABLET | Freq: Once | ORAL | Status: AC
  Administered 2020-01-29: 4 mg via ORAL
  Filled 2020-01-29: qty 1

## 2020-01-29 NOTE — ED Notes (Signed)
No answer when called several times from lobby 

## 2020-01-29 NOTE — ED Triage Notes (Addendum)
Pt comes with c/o COVID+ and cough and fever. Pt states some nausea and needs meds for it.

## 2020-01-29 NOTE — ED Provider Notes (Signed)
[facility] Emergency Department Provider Note  ____________________________________________   Event Date/Time   First MD Initiated Contact with Patient 01/29/20 1929     (approximate)  I have reviewed the triage vital signs and the nursing notes.   HISTORY  Chief Complaint Fever and Cough  HPI Frank Fernandez is a 40 y.o. male who reports to the emergency department for evaluation of known Covid with nausea.  Patient states that the nausea is bothering him enough that he needs medication for it given that he cannot eat.  He has not had any episodes of vomiting.  He denies any abdominal pain or diarrhea.  He reports mild shortness of breath with exertion and intermittent cough, but states that this does not bother him as much as the nausea.  He was tested on 01/24/20 and notified of his positive result on 01/26/20.  He denies any other symptoms.         History reviewed. No pertinent past medical history.  There are no problems to display for this patient.   History reviewed. No pertinent surgical history.  Prior to Admission medications   Medication Sig Start Date End Date Taking? Authorizing Provider  ondansetron (ZOFRAN) 4 MG tablet Take 1 tablet (4 mg total) by mouth every 8 (eight) hours as needed for up to 10 days for nausea or vomiting. 01/29/20 02/08/20 Yes Lucy Chris, PA    Allergies Patient has no known allergies.  No family history on file.  Social History Social History   Tobacco Use  . Smoking status: Current Every Day Smoker  . Smokeless tobacco: Never Used  Substance Use Topics  . Alcohol use: No  . Drug use: No    Review of Systems Constitutional: No fever/chills Eyes: No visual changes. ENT: No sore throat. Cardiovascular: Denies chest pain. Respiratory: Intermittent shortness of breath with exertion only. Gastrointestinal: No abdominal pain.  + nausea, no vomiting.  No diarrhea.  No constipation. Genitourinary:  Negative for dysuria. Musculoskeletal: Negative for back pain. Skin: Negative for rash. Neurological: Negative for headaches, focal weakness or numbness.  ____________________________________________   PHYSICAL EXAM:  VITAL SIGNS: ED Triage Vitals  Enc Vitals Group     BP 01/29/20 1853 113/67     Pulse Rate 01/29/20 1853 71     Resp 01/29/20 1853 18     Temp 01/29/20 1853 99 F (37.2 C)     Temp Source 01/29/20 1853 Oral     SpO2 01/29/20 1853 98 %     Weight 01/29/20 1853 195 lb (88.5 kg)     Height 01/29/20 1853 5\' 11"  (1.803 m)     Head Circumference --      Peak Flow --      Pain Score 01/29/20 1826 3     Pain Loc --      Pain Edu? --      Excl. in GC? --    Constitutional: Alert and oriented. Well appearing and in no acute distress. Eyes: Conjunctivae are normal. PERRL. EOMI. Head: Atraumatic. Nose: No congestion/rhinnorhea. Mouth/Throat: Mucous membranes are moist.  Oropharynx non-erythematous. Neck: No stridor.   Cardiovascular: Normal rate, regular rhythm. Grossly normal heart sounds.  Good peripheral circulation. Respiratory: Normal respiratory effort.  No retractions. Lungs CTAB. Gastrointestinal: Soft and nontender. No distention. No abdominal bruits. No CVA tenderness. Musculoskeletal: No lower extremity tenderness nor edema.  No joint effusions. Neurologic:  Normal speech and language. No gross focal neurologic deficits are appreciated. No gait instability. Skin:  Skin is warm, dry and intact. No rash noted. Psychiatric: Mood and affect are normal. Speech and behavior are normal.  ____________________________________________   INITIAL IMPRESSION / ASSESSMENT AND PLAN / ED COURSE  As part of my medical decision making, I reviewed the following data within the electronic MEDICAL RECORD NUMBER Nursing notes reviewed and incorporated        Patient is a 40 year old male presents to the emergency department for evaluation of known Covid with nausea.  Overall,  the patient is very well-appearing, has normal vital signs in triage and no focal abnormalities noted on exam.  We will treat the patient's nausea with ODT Zofran, which the patient is amenable with.  Patient was instructed to return to the emergency department with any acute worsening he is stable this time for outpatient therapy.      ____________________________________________   FINAL CLINICAL IMPRESSION(S) / ED DIAGNOSES  Final diagnoses:  COVID  Nausea     ED Discharge Orders         Ordered    ondansetron (ZOFRAN) 4 MG tablet  Every 8 hours PRN        01/29/20 2037          *Please note:  Frank Fernandez was evaluated in Emergency Department on 01/29/2020 for the symptoms described in the history of present illness. He was evaluated in the context of the global COVID-19 pandemic, which necessitated consideration that the patient might be at risk for infection with the SARS-CoV-2 virus that causes COVID-19. Institutional protocols and algorithms that pertain to the evaluation of patients at risk for COVID-19 are in a state of rapid change based on information released by regulatory bodies including the CDC and federal and state organizations. These policies and algorithms were followed during the patient's care in the ED.  Some ED evaluations and interventions may be delayed as a result of limited staffing during and the pandemic.*   Note:  This document was prepared using Dragon voice recognition software and may include unintentional dictation errors.    Lucy Chris, PA 01/29/20 2106    Arnaldo Natal, MD 01/29/20 [phone]

## 2020-01-29 NOTE — ED Notes (Signed)
No answer when called several times from lobby; no answer when cell phone # listed in chart called 

## 2020-01-30 NOTE — ED Notes (Signed)
Delay in charting. Pt left ED at 20:54 in NAD ambulatory without assistance.

## 2020-05-01 ENCOUNTER — Telehealth: Payer: Self-pay

## 2020-05-01 NOTE — Telephone Encounter (Signed)
After receiving an ER referral, tried calling pt at work. Someone from the office answered and said to try cell. I tried the number for cell and home but both kept ringing.   MD 05/01/20 @ 3:10

## 2020-06-10 ENCOUNTER — Ambulatory Visit: Payer: Self-pay | Admitting: Gerontology

## 2020-11-09 ENCOUNTER — Emergency Department (HOSPITAL_COMMUNITY)
Admission: EM | Admit: 2020-11-09 | Discharge: 2020-11-10 | Disposition: A | Discharge status: Home / Self Care | Payer: Medicaid Other | Attending: Emergency Medicine | Admitting: Emergency Medicine

## 2020-11-09 DIAGNOSIS — R109 Unspecified abdominal pain: Secondary | ICD-10-CM | POA: Insufficient documentation

## 2020-11-09 DIAGNOSIS — W108XXA Fall (on) (from) other stairs and steps, initial encounter: Secondary | ICD-10-CM | POA: Insufficient documentation

## 2020-11-09 DIAGNOSIS — R0781 Pleurodynia: Secondary | ICD-10-CM | POA: Insufficient documentation

## 2020-11-09 DIAGNOSIS — W19XXXA Unspecified fall, initial encounter: Secondary | ICD-10-CM

## 2020-11-09 DIAGNOSIS — R0789 Other chest pain: Secondary | ICD-10-CM | POA: Insufficient documentation

## 2020-11-09 DIAGNOSIS — R0682 Tachypnea, not elsewhere classified: Secondary | ICD-10-CM | POA: Insufficient documentation

## 2020-11-09 DIAGNOSIS — F17200 Nicotine dependence, unspecified, uncomplicated: Secondary | ICD-10-CM | POA: Insufficient documentation

## 2020-11-10 ENCOUNTER — Emergency Department (HOSPITAL_COMMUNITY): Payer: Medicaid Other

## 2020-11-10 ENCOUNTER — Other Ambulatory Visit: Payer: Self-pay

## 2020-11-10 ENCOUNTER — Encounter (HOSPITAL_COMMUNITY): Payer: Self-pay | Admitting: *Deleted

## 2020-11-10 LAB — COMPREHENSIVE METABOLIC PANEL
ALT: 18 U/L (ref 0–44)
AST: 23 U/L (ref 15–41)
Albumin: 2.9 g/dL — ABNORMAL LOW (ref 3.5–5.0)
Alkaline Phosphatase: 58 U/L (ref 38–126)
Anion gap: 5 (ref 5–15)
BUN: 26 mg/dL — ABNORMAL HIGH (ref 6–20)
CO2: 26 mmol/L (ref 22–32)
Calcium: 8.2 mg/dL — ABNORMAL LOW (ref 8.9–10.3)
Chloride: 108 mmol/L (ref 98–111)
Creatinine, Ser: 1.2 mg/dL (ref 0.61–1.24)
GFR, Estimated: >60 mL/min (ref >60)
Glucose, Bld: 97 mg/dL (ref 70–99)
Potassium: 3.9 mmol/L (ref 3.5–5.1)
Sodium: 139 mmol/L (ref 135–145)
Total Bilirubin: 0.6 mg/dL (ref 0.3–1.2)
Total Protein: 5.1 g/dL — ABNORMAL LOW (ref 6.5–8.1)

## 2020-11-10 LAB — CBC
HCT: 43.3 % (ref 39.0–52.0)
Hemoglobin: 14.6 g/dL (ref 13.0–17.0)
MCH: 31.3 pg (ref 26.0–34.0)
MCHC: 33.7 g/dL (ref 30.0–36.0)
MCV: 92.7 fL (ref 80.0–100.0)
Platelets: 215 10*3/uL (ref 150–400)
RBC: 4.67 MIL/uL (ref 4.22–5.81)
RDW: 13.1 % (ref 11.5–15.5)
WBC: 9.9 10*3/uL (ref 4.0–10.5)
nRBC: 0 % (ref 0.0–0.2)

## 2020-11-10 LAB — ETHANOL: Alcohol, Ethyl (B): <10 mg/dL (ref <10)

## 2020-11-10 MED ORDER — KETOROLAC TROMETHAMINE 15 MG/ML IJ SOLN
15.0000 mg | Freq: Once | INTRAMUSCULAR | Status: AC
  Administered 2020-11-10: 15 mg via INTRAVENOUS
  Filled 2020-11-10: qty 1

## 2020-11-10 MED ORDER — FENTANYL CITRATE PF 50 MCG/ML IJ SOSY
25.0000 ug | PREFILLED_SYRINGE | Freq: Once | INTRAMUSCULAR | Status: AC
Start: 2020-11-10 — End: 2020-11-10
  Administered 2020-11-10: 25 ug via INTRAVENOUS
  Filled 2020-11-10: qty 1

## 2020-11-10 MED ORDER — IOHEXOL 350 MG/ML SOLN
75.0000 mL | Freq: Once | INTRAVENOUS | Status: AC | PRN
  Administered 2020-11-10: 75 mL via INTRAVENOUS

## 2020-11-10 NOTE — ED Notes (Signed)
Pt given warm blanket.
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
## 2020-11-10 NOTE — ED Provider Notes (Signed)
[facility] Emergency Department Provider Note MRN:  [id number]  Arrival date & time: 11/10/20     Chief Complaint   Fall   History of Present Illness   Frank Fernandez is a 40 y.o. year-old male with no pertinent past medical history presenting to the ED with chief complaint of fall.  Patient reports tripping and falling down 1 flight of stairs.  Struck right side of ribs against a banister on the way down.  Endorsing moderate to severe pain to the right side of the chest and abdomen.  Endorsing head trauma but no loss of consciousness, no nausea or vomiting, no neck or back pain, no leg injuries.  Symptoms are constant, worse with motion or palpation to the right chest/abdomen.  Review of Systems  A complete 10 system review of systems was obtained and all systems are negative except as noted in the HPI and PMH.   Patient's Health History   History reviewed. No pertinent past medical history.  History reviewed. No pertinent surgical history.  History reviewed. No pertinent family history.  Social History   Socioeconomic History   Marital status: Single    Spouse name: Not on file   Number of children: Not on file   Years of education: Not on file   Highest education level: Not on file  Occupational History   Not on file  Tobacco Use   Smoking status: Every Day   Smokeless tobacco: Never  Substance and Sexual Activity   Alcohol use: No   Drug use: No   Sexual activity: Not on file  Other Topics Concern   Not on file  Social History Narrative   Not on file   Social Determinants of Health   Financial Resource Strain: Not on file  Food Insecurity: Not on file  Transportation Needs: Not on file  Physical Activity: Not on file  Stress: Not on file  Social Connections: Not on file  Intimate Partner Violence: Not on file     Physical Exam   Vitals:   11/10/20 0004  BP: (!) 129/98  Pulse: 61  Resp: (!) 22  Temp: 98 F (36.7 C)  SpO2:  99%    CONSTITUTIONAL: Well-appearing, NAD NEURO:  Alert and oriented x 3, no focal deficits EYES:  eyes equal and reactive ENT/NECK:  no LAD, no JVD CARDIO: Regular rate, well-perfused, normal S1 and S2; tenderness to the right lateral ribs PULM:  CTAB no wheezing or rhonchi GI/GU:  normal bowel sounds, non-distended, tenderness to the right lower quadrant MSK/SPINE:  No gross deformities, no edema SKIN:  no rash, atraumatic PSYCH:  Appropriate speech and behavior  *Additional and/or pertinent findings included in MDM below  Diagnostic and Interventional Summary    EKG Interpretation  Date/Time:    Ventricular Rate:    PR Interval:    QRS Duration:   QT Interval:    QTC Calculation:   R Axis:     Text Interpretation:         Labs Reviewed  COMPREHENSIVE METABOLIC PANEL - Abnormal; Notable for the following components:      Result Value   BUN 26 (*)    Calcium 8.2 (*)    Total Protein 5.1 (*)    Albumin 2.9 (*)    All other components within normal limits  CBC  ETHANOL    CT HEAD WO CONTRAST ( )  Final Result    CT ABDOMEN PELVIS W CONTRAST  Final Result    CT  CHEST W CONTRAST  Final Result    CT CERVICAL SPINE WO CONTRAST  Final Result    DG Chest 2 View  Final Result      Medications  ketorolac (TORADOL) 15 MG/ML injection 15 mg (15 mg Intravenous Given 11/10/20 0048)  fentaNYL (SUBLIMAZE) injection 25 mcg (25 mcg Intravenous Given 11/10/20 0048)  iohexol (OMNIPAQUE) 350 MG/ML injection 75 mL (75 mLs Intravenous Contrast Given 11/10/20 0412)     Procedures  /  Critical Care Procedures  ED Course and Medical Decision Making  I have reviewed the triage vital signs, the nursing notes, and pertinent available records from the EMR.  Listed above are laboratory and imaging tests that I personally ordered, reviewed, and interpreted and then considered in my medical decision making (see below for details).  Normal vital signs, mildly tachypneic,  tender to the right chest and right abdomen.  We will start with labs and chest x-ray to evaluate for signs of pneumothorax or rib fractures.  May need more advanced imaging depending on imaging, clinical status.  Unclear if patient is inebriated or altered, denies drug or alcohol use.     Work-up is reassuring with negative CT imaging.  Patient is appropriate for discharge, no emergent process present.  Elmer Sow. Pilar Plate, MD [facility] Emergency Medicine [facility] [email or web address]  Final Clinical Impressions(s) / ED Diagnoses     ICD-10-CM   1. Rib pain  R07.81     2. Fall  [email or web address].Lorne Skeens DG Chest 2 View    DG Chest 2 View      ED Discharge Orders     None        Discharge Instructions Discussed with and Provided to Patient:     Discharge Instructions      You were evaluated in the Emergency Department and after careful evaluation, we did not find any emergent condition requiring admission or further testing in the hospital.  Your exam/testing today was overall reassuring.  Please return to the Emergency Department if you experience any worsening of your condition.  Thank you for allowing [place] to be a part of your care.         Sabas Sous, MD 11/10/20 0600

## 2020-11-10 NOTE — ED Notes (Signed)
Message left again per pt request about pt's d/c, message left with/ at [facility] [place]. Their phone number is [phone].

## 2020-11-10 NOTE — ED Notes (Signed)
Pending d/c and contact with family, group home and/or guardian. Numbers called. Messages left. Alert, NAD, calm, resting under blanket. Breakfast ordered.

## 2020-11-10 NOTE — ED Triage Notes (Signed)
Pt brought in by [place] ems for c/o fall; pt fell down 10 feet of stairs breaking the banister at the bottom. Pt c/o right rib pain and feeling of unable to breath; pt given fentanyl 50 mcg en route by ems

## 2020-11-10 NOTE — ED Notes (Signed)
Pt gone to CT 

## 2020-11-10 NOTE — ED Notes (Signed)
Called and left message with number EMS provided, [phone], no answer but left message; attempted to call [place] listed in chart with no answer and no VM available

## 2020-11-10 NOTE — ED Notes (Signed)
Called [facility] listed in pt's contacts and left message on VM for a return call

## 2020-11-10 NOTE — Discharge Instructions (Addendum)
You were evaluated in the Emergency Department and after careful evaluation, we did not find any emergent condition requiring admission or further testing in the hospital.  Your exam/testing today was overall reassuring.  Please return to the Emergency Department if you experience any worsening of your condition.  Thank you for allowing us to be a part of your care.  

## 2021-04-28 ENCOUNTER — Encounter: Payer: Self-pay | Admitting: Neurology

## 2021-04-28 ENCOUNTER — Ambulatory Visit: Payer: Self-pay | Admitting: Neurology

## 2021-04-29 ENCOUNTER — Encounter: Payer: Self-pay | Admitting: Neurology

## 2021-04-29 ENCOUNTER — Ambulatory Visit (INDEPENDENT_AMBULATORY_CARE_PROVIDER_SITE_OTHER): Payer: PRIVATE HEALTH INSURANCE | Admitting: Neurology

## 2021-04-29 VITALS — BP 119/83 | HR 70 | Ht 71.0 in | Wt 184.0 lb

## 2021-04-29 DIAGNOSIS — G255 Other chorea: Secondary | ICD-10-CM | POA: Insufficient documentation

## 2021-04-29 DIAGNOSIS — R52 Pain, unspecified: Secondary | ICD-10-CM | POA: Diagnosis not present

## 2021-04-29 DIAGNOSIS — F32A Depression, unspecified: Secondary | ICD-10-CM | POA: Diagnosis not present

## 2021-04-29 MED ORDER — DULOXETINE HCL 60 MG PO CPEP: 60.0000 mg | ORAL_CAPSULE | Freq: Every day | ORAL | 11 refills | Status: DC
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
## 2021-04-30 ENCOUNTER — Telehealth: Payer: Self-pay | Admitting: Neurology

## 2021-04-30 NOTE — Telephone Encounter (Signed)
Mild elevated CPK and deviation on CMP panel have unknown significance, will wait rest of the lab, especially Huntington Disease result come back. ? ?It is OK to wait until all and rest of lab results and I come back to address them, unless it is markedly abnormal ?

## 2021-04-30 NOTE — Telephone Encounter (Signed)
I spoke to the patient's friend, [place] She verbalized understanding of the message and will relay to the patient.  ?

## 2021-05-11 ENCOUNTER — Telehealth: Payer: Self-pay | Admitting: Neurology

## 2021-05-11 DIAGNOSIS — G10 Huntington's disease: Secondary | ICD-10-CM

## 2021-05-11 NOTE — Telephone Encounter (Signed)
Huntington's disease results available for review in Dr. Zannie Cove office. ?

## 2021-05-11 NOTE — Telephone Encounter (Signed)
I reviewed Huntington genetic testing dated April 30, 2021, CAG repeat number ?Allele 1 15, Allele 2, 52,  ? 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 ?Rest of the laboratory evaluation showed normal CMP, TSH, negative alcohol, HIV, RPR, ANA, copper, C-reactive protein, ceruloplasmin ? ?-mild elevated CPK, 667 of unknown clinical significance, ?-Mild low normal B12 he may benefit over-the-counter B12 supplement 1000 mcg daily. ? ?Find out who is his PCP, we will fax the results and diagnoses over ?

## 2021-05-11 NOTE — Telephone Encounter (Signed)
I called [facility]. They will fax [place] the Huntingtin's disease result today. ?

## 2021-05-11 NOTE — Telephone Encounter (Signed)
The patient does not have an established PCP.  ?

## 2021-05-11 NOTE — Telephone Encounter (Signed)
?  Please find out Huntington's disease genetic test result ? ? ?

## 2021-05-12 ENCOUNTER — Telehealth: Payer: Self-pay | Admitting: Neurology

## 2021-05-12 DIAGNOSIS — G10 Huntington's disease: Secondary | ICD-10-CM | POA: Insufficient documentation

## 2021-05-12 NOTE — Telephone Encounter (Signed)
Referral sent to [facility] [phone]. ?

## 2021-05-12 NOTE — Addendum Note (Signed)
Addended by: Marcial Pacas on: 05/12/2021 03:53 PM ? ? Modules accepted: Orders ? ?

## 2021-05-14 LAB — CBC WITH DIFFERENTIAL/PLATELET
Basophils Absolute: 0.1 10*3/uL (ref 0.0–0.2)
Basos: 1 %
EOS (ABSOLUTE): 0.2 10*3/uL (ref 0.0–0.4)
Eos: 2 %
Hematocrit: 47.2 % (ref 37.5–51.0)
Hemoglobin: 16 g/dL (ref 13.0–17.7)
Immature Grans (Abs): 0 10*3/uL (ref 0.0–0.1)
Immature Granulocytes: 0 %
Lymphocytes Absolute: 2.3 10*3/uL (ref 0.7–3.1)
Lymphs: 26 %
MCH: 30.9 pg (ref 26.6–33.0)
MCHC: 33.9 g/dL (ref 31.5–35.7)
MCV: 91 fL (ref 79–97)
Monocytes Absolute: 0.6 10*3/uL (ref 0.1–0.9)
Monocytes: 7 %
Neutrophils Absolute: 5.8 10*3/uL (ref 1.4–7.0)
Neutrophils: 64 %
Platelets: 210 10*3/uL (ref 150–450)
RBC: 5.18 x10E6/uL (ref 4.14–5.80)
RDW: 12.9 % (ref 11.6–15.4)
WBC: 9 10*3/uL (ref 3.4–10.8)

## 2021-05-14 LAB — COCAINE,MS,WB/SP RFX
Benzoylecgonine: 41 ng/mL
Cocaine Confirmation: POSITIVE
Cocaine: NEGATIVE ng/mL

## 2021-05-14 LAB — HIV ANTIBODY (ROUTINE TESTING W REFLEX): HIV Screen 4th Generation wRfx: NONREACTIVE

## 2021-05-14 LAB — COMPREHENSIVE METABOLIC PANEL
ALT: 25 IU/L (ref 0–44)
AST: 32 IU/L (ref 0–40)
Albumin/Globulin Ratio: 1.9 (ref 1.2–2.2)
Albumin: 3.8 g/dL — ABNORMAL LOW (ref 4.0–5.0)
Alkaline Phosphatase: 75 IU/L (ref 44–121)
BUN/Creatinine Ratio: 22 — ABNORMAL HIGH (ref 9–20)
BUN: 22 mg/dL — ABNORMAL HIGH (ref 6–20)
Bilirubin Total: 0.4 mg/dL (ref 0.0–1.2)
CO2: 23 mmol/L (ref 20–29)
Calcium: 8.6 mg/dL — ABNORMAL LOW (ref 8.7–10.2)
Chloride: 107 mmol/L — ABNORMAL HIGH (ref 96–106)
Creatinine, Ser: 0.99 mg/dL (ref 0.76–1.27)
Globulin, Total: 2 g/dL (ref 1.5–4.5)
Glucose: 89 mg/dL (ref 70–99)
Potassium: 4.3 mmol/L (ref 3.5–5.2)
Sodium: 143 mmol/L (ref 134–144)
Total Protein: 5.8 g/dL — ABNORMAL LOW (ref 6.0–8.5)
eGFR: 100 mL/min/{1.73_m2} (ref >59)

## 2021-05-14 LAB — RPR: RPR Ser Ql: NONREACTIVE

## 2021-05-14 LAB — DRUG SCREEN 10 W/CONF, SERUM
Amphetamines, IA: NEGATIVE ng/mL
Barbiturates, IA: NEGATIVE ug/mL
Benzodiazepines, IA: NEGATIVE ng/mL
Cocaine & Metabolite, IA: POSITIVE ng/mL — AB
Methadone, IA: NEGATIVE ng/mL
Opiates, IA: NEGATIVE ng/mL
Oxycodones, IA: NEGATIVE ng/mL
Phencyclidine, IA: NEGATIVE ng/mL
Propoxyphene, IA: NEGATIVE ng/mL
THC(Marijuana) Metabolite, IA: NEGATIVE ng/mL

## 2021-05-14 LAB — HUNTINGTON DISEASE REPEAT EXP

## 2021-05-14 LAB — CK: Total CK: 667 U/L (ref 49–439)

## 2021-05-14 LAB — ETHANOL

## 2021-05-14 LAB — FOLATE: Folate: 5 ng/mL (ref >3.0)

## 2021-05-14 LAB — SEDIMENTATION RATE: Sed Rate: 5 mm/hr (ref 0–15)

## 2021-05-14 LAB — THYROID PANEL WITH TSH
Free Thyroxine Index: 1.4 (ref 1.2–4.9)
T3 Uptake Ratio: 23 % — ABNORMAL LOW (ref 24–39)
T4, Total: 6.1 ug/dL (ref 4.5–12.0)
TSH: 1.7 u[IU]/mL (ref 0.450–4.500)

## 2021-05-14 LAB — CERULOPLASMIN: Ceruloplasmin: 21.8 mg/dL (ref 16.0–31.0)

## 2021-05-14 LAB — C-REACTIVE PROTEIN: CRP: 2 mg/L (ref 0–10)

## 2021-05-14 LAB — VITAMIN B12: Vitamin B-12: 254 pg/mL (ref 232–1245)

## 2021-05-14 LAB — COPPER, SERUM: Copper: 90 ug/dL (ref 69–132)

## 2021-05-14 LAB — ANA W/REFLEX IF POSITIVE: Anti Nuclear Antibody (ANA): NEGATIVE

## 2021-07-14 ENCOUNTER — Encounter: Payer: Self-pay | Admitting: Emergency Medicine

## 2021-07-14 ENCOUNTER — Emergency Department
Admission: EM | Admit: 2021-07-14 | Discharge: 2021-07-14 | Disposition: A | Discharge status: Home / Self Care | Payer: Medicaid Other | Attending: Emergency Medicine | Admitting: Emergency Medicine

## 2021-07-14 DIAGNOSIS — H1032 Unspecified acute conjunctivitis, left eye: Secondary | ICD-10-CM | POA: Insufficient documentation

## 2021-07-14 DIAGNOSIS — H5789 Other specified disorders of eye and adnexa: Secondary | ICD-10-CM

## 2021-07-14 MED ORDER — AMOXICILLIN-POT CLAVULANATE 875-125 MG PO TABS: 1.0000 | ORAL_TABLET | Freq: Two times a day (BID) | ORAL | 0 refills | Status: AC

## 2021-07-14 MED ORDER — ERYTHROMYCIN 5 MG/GM OP OINT: 1.0000 | TOPICAL_OINTMENT | Freq: Every day | OPHTHALMIC | 0 refills | Status: DC

## 2021-07-14 NOTE — Discharge Instructions (Signed)
Please take the antibiotics as prescribed.  Please return for any new, worsening, or change in symptoms or other concerns.  It was a pleasure caring for you today. 

## 2021-07-14 NOTE — ED Triage Notes (Signed)
Pt endorses left eye swelling x4 days.

## 2021-07-14 NOTE — ED Provider Notes (Signed)
[facility] Provider Note    Event Date/Time   First MD Initiated Contact with Patient 07/14/21 1712     (approximate)   History   Eye Problem   HPI  Frank Fernandez is a 41 y.o. male with a past medical history of Huntington's disease who presents today for evaluation of left eye irritation.  Patient reports that he has had allergies and has been rubbing his eye a lot, and has developed thick discharge from his eye.  He also reports that over the last couple of days he has had swelling around his eye.  He denies any vision changes or pain with moving his eye.  He has not had any fevers or chills.  He denies wearing contact lenses.  He denies double vision.  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 --      Excl. in GC? --     Most recent vital signs: Vitals:   07/14/21 1515 07/14/21 1748  BP: 110/75 115/78  Pulse: (!) 56 60  Resp: 18 16  Temp: 98.2 F (36.8 C)   SpO2: 94% 95%    Physical Exam Vitals and nursing note reviewed.  Constitutional:      General: Awake and alert. No acute distress.    Appearance: Normal appearance. He is well-developed and normal weight.  HENT:     Head: Normocephalic and atraumatic.     Mouth: Mucous membranes are moist.  Eyes:     General: PERRL. Normal EOMs        Right eye: No discharge.        Left eye: Mucopurulent  drainage along eyelashes with crusting.  Conjunctival injection with chemosis noted to left eye.  Mild periorbital edema without overt erythema.  Normal and full extraocular movements without pain.  No proptosis.  No hyphema or hypopyon. Cardiovascular:     Rate and Rhythm: Normal rate and regular rhythm.     Pulses: Normal pulses.  Pulmonary:     Effort: Pulmonary effort is normal. No respiratory distress.  Abdominal:     Abdomen is soft.  Musculoskeletal:        General: No swelling. Normal range of motion.     Cervical back: Normal range of motion and neck supple.  Skin:    General: Skin is warm and dry.     Capillary Refill: Capillary refill takes less than 2 seconds.     Findings: No rash.  Neurological:     Mental Status: He is alert.      ED Results / Procedures / Treatments  Labs (all labs ordered are listed, but only abnormal results are displayed) Labs Reviewed - No data to display   EKG     RADIOLOGY     PROCEDURES:  Critical Care performed:   Procedures   MEDICATIONS ORDERED IN ED: Medications - No data to display   IMPRESSION / MDM / ASSESSMENT AND PLAN / ED COURSE  I reviewed the triage vital signs and the nursing notes.   Differential diagnosis includes, but is not limited to, conjunctivitis, preseptal cellulitis, orbital cellulitis.  No foreign body sensation to suggest retained foreign body or corneal abrasion/ulceration.  He does not wear contact lenses.  There is no hyphema or hypopyon.  He does have periorbital swelling without overt erythema, though mild tenderness to palpation.  He has full and painless extraocular movements without proptosis.  Not consistent with orbital cellulitis.  Pupil is round and reactive, no trauma to suggest open globe.  No teardrop pupil.  He was started on erythromycin ointment for conjunctivitis, as well as outpatient antibiotics for possible developing preseptal cellulitis.  No vision changes or diplopia.  We  discussed tricked return precautions and the importance of close outpatient follow-up.  Patient understands and agrees with plan.  Discharged in stable condition.   Patient's presentation is most consistent with acute illness / injury with system symptoms.     FINAL CLINICAL IMPRESSION(S) / ED DIAGNOSES   Final diagnoses:  Acute conjunctivitis of left eye, unspecified acute conjunctivitis type  Periorbital swelling     Rx / DC Orders   ED Discharge Orders          Ordered    erythromycin ophthalmic ointment  Daily at bedtime        07/14/21 1738    amoxicillin-clavulanate (AUGMENTIN) 875-125 MG tablet  2 times daily        07/14/21 1738             Note:  This document was prepared using Dragon voice recognition software and may include unintentional dictation errors.   Keturah Shavers 07/14/21 1909    Minna Antis, MD 07/14/21 2253
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
## 2021-08-31 ENCOUNTER — Ambulatory Visit: Payer: Medicaid Other | Admitting: Adult Health

## 2021-08-31 ENCOUNTER — Encounter: Payer: Self-pay | Admitting: Adult Health

## 2021-09-07 ENCOUNTER — Ambulatory Visit: Payer: Medicaid Other | Admitting: Adult Health

## 2021-09-07 ENCOUNTER — Encounter: Payer: Self-pay | Admitting: Adult Health
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
## 2021-10-22 ENCOUNTER — Ambulatory Visit: Payer: PRIVATE HEALTH INSURANCE | Admitting: Podiatry
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
## 2022-01-04 ENCOUNTER — Telehealth: Payer: Self-pay | Admitting: Adult Health

## 2022-01-04 ENCOUNTER — Telehealth: Payer: Self-pay | Admitting: *Deleted

## 2022-01-04 ENCOUNTER — Encounter: Payer: Self-pay | Admitting: Adult Health

## 2022-01-04 ENCOUNTER — Ambulatory Visit: Payer: Self-pay | Admitting: Adult Health

## 2022-01-04 NOTE — Telephone Encounter (Signed)
Pt no showed for appoinment today.

## 2022-01-04 NOTE — Telephone Encounter (Signed)
Pt has had four no show appointments for 2023

## 2022-01-04 NOTE — Telephone Encounter (Signed)
Angie- please dismiss

## 2022-01-05 ENCOUNTER — Encounter: Payer: Self-pay | Admitting: Adult Health
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
## 2022-04-14 ENCOUNTER — Ambulatory Visit: Payer: Self-pay | Admitting: Family Medicine
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
## 2022-06-25 ENCOUNTER — Ambulatory Visit: Payer: Medicaid Other | Admitting: Nurse Practitioner

## 2022-07-01 ENCOUNTER — Ambulatory Visit: Payer: Medicaid Other | Admitting: Nurse Practitioner

## 2022-07-08 ENCOUNTER — Ambulatory Visit: Payer: Medicaid Other | Admitting: Family
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
## 2022-08-11 ENCOUNTER — Ambulatory Visit: Payer: MEDICAID | Admitting: Family
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
## 2022-10-22 ENCOUNTER — Ambulatory Visit: Payer: 59 | Admitting: Cardiology

## 2022-10-27 ENCOUNTER — Ambulatory Visit (INDEPENDENT_AMBULATORY_CARE_PROVIDER_SITE_OTHER): Payer: 59 | Admitting: Cardiology

## 2022-10-27 ENCOUNTER — Encounter: Payer: Self-pay | Admitting: Cardiology

## 2022-10-27 VITALS — BP 109/62 | HR 89 | Ht 71.0 in | Wt 174.0 lb

## 2022-10-27 DIAGNOSIS — Z13220 Encounter for screening for lipoid disorders: Secondary | ICD-10-CM

## 2022-10-27 DIAGNOSIS — M79604 Pain in right leg: Secondary | ICD-10-CM

## 2022-10-27 DIAGNOSIS — Z1329 Encounter for screening for other suspected endocrine disorder: Secondary | ICD-10-CM | POA: Insufficient documentation

## 2022-10-27 DIAGNOSIS — Z131 Encounter for screening for diabetes mellitus: Secondary | ICD-10-CM | POA: Diagnosis not present

## 2022-10-27 DIAGNOSIS — G8929 Other chronic pain: Secondary | ICD-10-CM
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
## 2022-10-28 ENCOUNTER — Ambulatory Visit: Payer: 59 | Admitting: Cardiology

## 2022-11-26 ENCOUNTER — Ambulatory Visit: Payer: 59 | Admitting: Cardiology

## 2022-12-16 ENCOUNTER — Ambulatory Visit (INDEPENDENT_AMBULATORY_CARE_PROVIDER_SITE_OTHER): Payer: 59 | Admitting: Cardiology

## 2022-12-16 ENCOUNTER — Encounter: Payer: Self-pay | Admitting: Cardiology

## 2022-12-16 VITALS — HR 91 | Ht 76.0 in | Wt 166.6 lb

## 2022-12-16 DIAGNOSIS — Z131 Encounter for screening for diabetes mellitus: Secondary | ICD-10-CM | POA: Diagnosis not present

## 2022-12-16 DIAGNOSIS — G8929 Other chronic pain: Secondary | ICD-10-CM | POA: Diagnosis not present

## 2022-12-16 DIAGNOSIS — Z1329 Encounter for screening for other suspected endocrine disorder: Secondary | ICD-10-CM

## 2022-12-16 DIAGNOSIS — M79604 Pain in right leg: Secondary | ICD-10-CM | POA: Diagnosis not present

## 2022-12-16 DIAGNOSIS — F4310 Post-traumatic stress disorder, unspecified: Secondary | ICD-10-CM | POA: Diagnosis not present

## 2022-12-16 DIAGNOSIS — Z13220 Encounter for screening for lipoid disorders: Secondary | ICD-10-CM
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
## 2022-12-17 LAB — CMP14+EGFR
ALT: 15 [IU]/L (ref 0–44)
AST: 15 [IU]/L (ref 0–40)
Albumin: 3.9 g/dL — ABNORMAL LOW (ref 4.1–5.1)
Alkaline Phosphatase: 89 [IU]/L (ref 44–121)
BUN/Creatinine Ratio: 28 — ABNORMAL HIGH (ref 9–20)
BUN: 29 mg/dL — ABNORMAL HIGH (ref 6–24)
Bilirubin Total: 0.2 mg/dL (ref 0.0–1.2)
CO2: 23 mmol/L (ref 20–29)
Calcium: 9 mg/dL (ref 8.7–10.2)
Chloride: 105 mmol/L (ref 96–106)
Creatinine, Ser: 1.02 mg/dL (ref 0.76–1.27)
Globulin, Total: 2.8 g/dL (ref 1.5–4.5)
Glucose: 105 mg/dL — ABNORMAL HIGH (ref 70–99)
Potassium: 4.2 mmol/L (ref 3.5–5.2)
Sodium: 142 mmol/L (ref 134–144)
Total Protein: 6.7 g/dL (ref 6.0–8.5)
eGFR: 95 mL/min/{1.73_m2} (ref >59)

## 2022-12-17 LAB — CBC WITH DIFFERENTIAL/PLATELET
Basophils Absolute: 0.1 10*3/uL (ref 0.0–0.2)
Basos: 1 %
EOS (ABSOLUTE): 0.2 10*3/uL (ref 0.0–0.4)
Eos: 3 %
Hematocrit: 47.3 % (ref 37.5–51.0)
Hemoglobin: 15.4 g/dL (ref 13.0–17.7)
Immature Grans (Abs): 0 10*3/uL (ref 0.0–0.1)
Immature Granulocytes: 0 %
Lymphocytes Absolute: 3 10*3/uL (ref 0.7–3.1)
Lymphs: 33 %
MCH: 30.5 pg (ref 26.6–33.0)
MCHC: 32.6 g/dL (ref 31.5–35.7)
MCV: 94 fL (ref 79–97)
Monocytes Absolute: 0.9 10*3/uL (ref 0.1–0.9)
Monocytes: 10 %
Neutrophils Absolute: 4.9 10*3/uL (ref 1.4–7.0)
Neutrophils: 53 %
Platelets: 240 10*3/uL (ref 150–450)
RBC: 5.05 x10E6/uL (ref 4.14–5.80)
RDW: 12 % (ref 11.6–15.4)
WBC: 9.1 10*3/uL (ref 3.4–10.8)

## 2022-12-17 LAB — HEMOGLOBIN A1C
Est. average glucose Bld gHb Est-mCnc: 120 mg/dL
Hgb A1c MFr Bld: 5.8 % — ABNORMAL HIGH (ref 4.8–5.6)

## 2022-12-17 LAB — LIPID PANEL
Chol/HDL Ratio: 3.6 ratio (ref 0.0–5.0)
Cholesterol, Total: 168 mg/dL (ref 100–199)
HDL: 47 mg/dL (ref >39)
LDL Chol Calc (NIH): 98 mg/dL (ref 0–99)
Triglycerides: 132 mg/dL (ref 0–149)
VLDL Cholesterol Cal: 23 mg/dL (ref 5–40)

## 2022-12-17 LAB — TSH: TSH: 1.05 u[IU]/mL (ref 0.450–4.500)

## 2022-12-21 NOTE — Progress Notes (Signed)
Patient notified

## 2023-03-05 ENCOUNTER — Other Ambulatory Visit: Payer: Self-pay

## 2023-03-05 ENCOUNTER — Emergency Department
Admission: EM | Admit: 2023-03-05 | Discharge: 2023-03-05 | Disposition: A | Discharge status: Home / Self Care | Payer: 59 | Attending: Emergency Medicine | Admitting: Emergency Medicine

## 2023-03-05 ENCOUNTER — Encounter: Payer: Self-pay | Admitting: Intensive Care

## 2023-03-05 ENCOUNTER — Emergency Department (EMERGENCY_DEPARTMENT_HOSPITAL)
Admission: EM | Admit: 2023-03-05 | Discharge: 2023-03-05 | Disposition: A | Discharge status: Home / Self Care | Payer: 59 | Source: Home / Self Care | Attending: Emergency Medicine | Admitting: Emergency Medicine

## 2023-03-05 DIAGNOSIS — R441 Visual hallucinations: Secondary | ICD-10-CM | POA: Insufficient documentation

## 2023-03-05 DIAGNOSIS — F22 Delusional disorders: Secondary | ICD-10-CM | POA: Diagnosis not present

## 2023-03-05 DIAGNOSIS — Z79899 Other long term (current) drug therapy: Secondary | ICD-10-CM | POA: Insufficient documentation

## 2023-03-05 DIAGNOSIS — Z5901 Sheltered homelessness: Secondary | ICD-10-CM | POA: Insufficient documentation

## 2023-03-05 DIAGNOSIS — F29 Unspecified psychosis not due to a substance or known physiological condition: Secondary | ICD-10-CM | POA: Insufficient documentation

## 2023-03-05 DIAGNOSIS — F19959 Other psychoactive substance use, unspecified with psychoactive substance-induced psychotic disorder, unspecified: Secondary | ICD-10-CM | POA: Insufficient documentation

## 2023-03-05 DIAGNOSIS — B829 Intestinal parasitism, unspecified: Secondary | ICD-10-CM | POA: Insufficient documentation

## 2023-03-05 DIAGNOSIS — F0282 Dementia in other diseases classified elsewhere, unspecified severity, with psychotic disturbance: Secondary | ICD-10-CM | POA: Diagnosis not present

## 2023-03-05 DIAGNOSIS — F1410 Cocaine abuse, uncomplicated: Secondary | ICD-10-CM | POA: Insufficient documentation

## 2023-03-05 DIAGNOSIS — L299 Pruritus, unspecified: Secondary | ICD-10-CM | POA: Insufficient documentation

## 2023-03-05 DIAGNOSIS — R443 Hallucinations, unspecified: Secondary | ICD-10-CM

## 2023-03-05 DIAGNOSIS — G10 Huntington's disease: Secondary | ICD-10-CM

## 2023-03-05 HISTORY — DX: Other chorea: G25.5

## 2023-03-05 HISTORY — DX: Huntington's disease: G10

## 2023-03-05 HISTORY — DX: Post-traumatic stress disorder, unspecified: F43.10

## 2023-03-05 LAB — CBC
HCT: 44.4 % (ref 39.0–52.0)
Hemoglobin: 15 g/dL (ref 13.0–17.0)
MCH: 31.6 pg (ref 26.0–34.0)
MCHC: 33.8 g/dL (ref 30.0–36.0)
MCV: 93.7 fL (ref 80.0–100.0)
Platelets: 207 10*3/uL (ref 150–400)
RBC: 4.74 MIL/uL (ref 4.22–5.81)
RDW: 12.8 % (ref 11.5–15.5)
WBC: 10 10*3/uL (ref 4.0–10.5)
nRBC: 0 % (ref 0.0–0.2)

## 2023-03-05 LAB — COMPREHENSIVE METABOLIC PANEL
ALT: 38 U/L (ref 0–44)
AST: 23 U/L (ref 15–41)
Albumin: 4.2 g/dL (ref 3.5–5.0)
Alkaline Phosphatase: 66 U/L (ref 38–126)
Anion gap: 11 (ref 5–15)
BUN: 30 mg/dL — ABNORMAL HIGH (ref 6–20)
CO2: 26 mmol/L (ref 22–32)
Calcium: 8.9 mg/dL (ref 8.9–10.3)
Chloride: 105 mmol/L (ref 98–111)
Creatinine, Ser: 0.98 mg/dL (ref 0.61–1.24)
GFR, Estimated: >60 mL/min (ref >60)
Glucose, Bld: 176 mg/dL — ABNORMAL HIGH (ref 70–99)
Potassium: 2.8 mmol/L — ABNORMAL LOW (ref 3.5–5.1)
Sodium: 142 mmol/L (ref 135–145)
Total Bilirubin: 0.6 mg/dL (ref 0.0–1.2)
Total Protein: 7.6 g/dL (ref 6.5–8.1)

## 2023-03-05 LAB — URINE DRUG SCREEN, QUALITATIVE (ARMC ONLY)
Amphetamines, Ur Screen: NOT DETECTED
Barbiturates, Ur Screen: NOT DETECTED
Benzodiazepine, Ur Scrn: NOT DETECTED
Cannabinoid 50 Ng, Ur ~~LOC~~: NOT DETECTED
Cocaine Metabolite,Ur ~~LOC~~: POSITIVE — AB
MDMA (Ecstasy)Ur Screen: NOT DETECTED
Methadone Scn, Ur: NOT DETECTED
Opiate, Ur Screen: NOT DETECTED
Phencyclidine (PCP) Ur S: NOT DETECTED
Tricyclic, Ur Screen: NOT DETECTED

## 2023-03-05 LAB — SALICYLATE LEVEL: Salicylate Lvl: <7 mg/dL — ABNORMAL LOW (ref 7.0–30.0)

## 2023-03-05 LAB — HEMOGLOBIN A1C
Hgb A1c MFr Bld: 5.4 % (ref 4.8–5.6)
Mean Plasma Glucose: 108.28 mg/dL

## 2023-03-05 LAB — ACETAMINOPHEN LEVEL: Acetaminophen (Tylenol), Serum: <10 ug/mL — ABNORMAL LOW (ref 10–30)

## 2023-03-05 LAB — ETHANOL: Alcohol, Ethyl (B): <10 mg/dL (ref <10)

## 2023-03-05 MED ORDER — LORAZEPAM 1 MG PO TABS
1.0000 mg | ORAL_TABLET | Freq: Once | ORAL | Status: AC
  Administered 2023-03-05: 1 mg via ORAL
  Filled 2023-03-05: qty 1

## 2023-03-05 MED ORDER — OLANZAPINE 5 MG PO TBDP
10.0000 mg | ORAL_TABLET | Freq: Once | ORAL | Status: AC
  Administered 2023-03-05: 10 mg via ORAL
  Filled 2023-03-05: qty 2

## 2023-03-05 MED ORDER — LORAZEPAM 2 MG PO TABS
2.0000 mg | ORAL_TABLET | Freq: Once | ORAL | Status: AC
  Administered 2023-03-05: 2 mg via ORAL
  Filled 2023-03-05: qty 1

## 2023-03-05 NOTE — ED Notes (Signed)
 PAtient dressed out with Art therapist, [employer], and Cam security

## 2023-03-05 NOTE — Discharge Instructions (Signed)
 Please seek medical attention for any high fevers, chest pain, shortness of breath, change in behavior, persistent vomiting, bloody stool or any other new or concerning symptoms.

## 2023-03-05 NOTE — ED Notes (Signed)
 Pt again asking for staff to come to bedside he was holding arm out stating look here look here as staff was standing reassuring again there is no indication that there is any insects on him pt again exposes his penis and testicles and stated I told you they right there  Staff redirects patient once again. ed md notified

## 2023-03-05 NOTE — ED Notes (Signed)
 Hospital meal provided, pt tolerated w/o complaints.  Waste discarded appropriately.

## 2023-03-05 NOTE — ED Notes (Signed)
 Pt called staff to bedside then exposed testicles and penis and stated Look see I told you there they go  Pt was pulling at groin, he believes there are bugs crawling all over him.  Staff continues to reassure patient that there are no bug on him.  Cont to monitor as ordered

## 2023-03-05 NOTE — ED Notes (Signed)
Patient is vol pending consult

## 2023-03-05 NOTE — ED Notes (Addendum)
 This RN spoke with [name] [phone].  Frank Fernandez notified that pt is being discharged and she states she will come to pick him up and return pt to group home.    Pt belongings returned but pt states he does not wish to change back into street clothing at this time.

## 2023-03-05 NOTE — ED Triage Notes (Signed)
 Pt reports he feels like he has something crawling on the left side of his head. Pt denies any falls or injuries to area. Pt reports he lives at a boarding house. Pt reports he has tremors

## 2023-03-05 NOTE — BH Assessment (Signed)
 Comprehensive Clinical Assessment (CCA) Screening, Triage and Referral Note  03/05/2023 [facility] [place] [id number]  Chief Complaint:  Chief Complaint  Patient presents with   Hallucinations   Visit Diagnosis: Substance Induced Psychosis  Frank Fernandez is a 43 year old male who presents to the ER via his Peer Environmental manager, due to hallucinations of bugs are crawling on him. Per Peer Support, when he was seen by them yesterday (03/04/2023), he was doing well and wasn't having any hallucinations. However, he reported he took a pill and was unsure what it was. Following that, he started having the hallucinations. The patient's presentation, in comparison to when he initially arrived to ER and now, he has improved. He reports there are fewer bugs crawling on him. He states, this has happened in the past and was giving medication and it stopped. Patient denies the use of illicit substance but UDS is positive for cocaine . Throughout the interview, he denied SI/HI and AV/H.  Patient Reported Information How did you hear about us ? Self  What Is the Reason for Your Visit/Call Today? Patient brought to the ER via his Peer Support worker, due to the belief bugs are crawling on him.  How Long Has This Been Causing You Problems? 1 wk - 1 month  What Do You Feel Would Help You the Most Today? Treatment for Depression or other mood problem; Alcohol or Drug Use Treatment   Have You Recently Had Any Thoughts About Hurting Yourself? No  Are You Planning to Commit Suicide/Harm Yourself At This time? No   Have you Recently Had Thoughts About Hurting Someone Frank Fernandez? No  Are You Planning to Harm Someone at This Time? No  Explanation: No data recorded  Have You Used Any Alcohol or Drugs in the Past 24 Hours? No (Patient denies use, but UDS is positive for cocaine .)  How Long Ago Did You Use Drugs or Alcohol? No data recorded What Did You Use and How Much? No data recorded  Do You Currently  Have a Therapist/Psychiatrist? Yes  Name of Therapist/Psychiatrist: Surveyor, Quantity.   Have You Been Recently Discharged From Any Office Practice or Programs? No  Explanation of Discharge From Practice/Program: No data recorded   CCA Screening Triage Referral Assessment Type of Contact: Face-to-Face  Telemedicine Service Delivery:   Is this Initial or Reassessment?   Date Telepsych consult ordered in [facility]:    Time Telepsych consult ordered in [facility]:    Location of Assessment: [facility] ED  Provider Location: [facility] ED    Collateral Involvement: No data recorded  Does Patient Have a Court Appointed Legal Guardian? No data recorded Name and Contact of Legal Guardian: No data recorded If Minor and Not Living with Parent(s), Who has Custody? No data recorded Is CPS involved or ever been involved? Never  Is APS involved or ever been involved? Never   Patient Determined To Be At Risk for Harm To Self or Others Based on Review of Patient Reported Information or Presenting Complaint? No  Method: No data recorded Availability of Means: No data recorded Intent: No data recorded Notification Required: No data recorded Additional Information for Danger to Others Potential: No data recorded Additional Comments for Danger to Others Potential: No data recorded Are There Guns or Other Weapons in Your Home? No data recorded Types of Guns/Weapons: No data recorded Are These Weapons Safely Secured?  No data recorded Who Could Verify You Are Able To Have These Secured: No data recorded Do You Have any Outstanding Charges, Pending Court Dates, Parole/Probation? No data recorded Contacted To Inform of Risk of Harm To Self or Others: No data recorded  Does Patient Present under Involuntary Commitment? No   [place] of Residence: [place]   Patient Currently Receiving the Following Services: Peer Support Services   Determination of Need: Emergent (2  hours)   Options For Referral: ED Visit   Disposition Recommendation per psychiatric provider: Pending Psych Consult.
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
## 2023-03-05 NOTE — ED Notes (Addendum)
 Pt up, requesting to leave slept from 1330 - 1645

## 2023-03-05 NOTE — ED Provider Notes (Signed)
   [facility] Provider Note    Event Date/Time   First MD Initiated Contact with Patient 03/05/23 [phone]     (approximate)   History   Feels like something is crawling in his head    HPI  Frank Fernandez is a 43 y.o. male who presents to the [facility] department today because of concerns for sensation of having something in his here.  He feels it primarily in the left side of his head and the back of his scalp.  He thinks he got it from somebody else.     Physical Exam   Triage Vital Signs: ED Triage Vitals  Encounter Vitals Group     BP 03/05/23 0238 (!) 127/93     Systolic BP Percentile --      Diastolic BP Percentile --      Pulse Rate 03/05/23 0238 (!) 106     Resp 03/05/23 0238 20     Temp 03/05/23 0238 98.2 F (36.8 C)     Temp Source 03/05/23 0238 Oral     SpO2 03/05/23 0238 97 %     Weight 03/05/23 0240 175 lb (79.4 kg)     Height 03/05/23 0240 5' 11 (1.803 m)     Head Circumference --      Peak Flow --      Pain Score 03/05/23 0240 0     Pain Loc --      Pain Education --      Exclude from Growth Chart --     Most recent vital signs: Vitals:   03/05/23 0238  BP: (!) 127/93  Pulse: (!) 106  Resp: 20  Temp: 98.2 F (36.8 C)  SpO2: 97%   General: Awake, alert. CV:  Good peripheral perfusion.  Resp:  Normal effort.  Abd:  No distention.  Other:  No FB/No parasites identified on scalp or in hair   ED Results / Procedures / Treatments   Labs (all labs ordered are listed, but only abnormal results are displayed) Labs Reviewed - No data to display   EKG  None   RADIOLOGY None   PROCEDURES:  Critical Care performed: No MEDICATIONS ORDERED IN ED: Medications - No data to display   IMPRESSION / MDM / ASSESSMENT AND PLAN / ED COURSE  I reviewed the triage vital signs and the nursing notes.                              Differential diagnosis includes, but is not limited to, pruritus, dermatitis  Patient's  presentation is most consistent with acute presentation with potential threat to life or bodily function.   Patient presented to the emergency department today with concerns for having something in his hearing.  On exam however no foreign body or parasites identified.  At this time I think is reasonable for patient be discharged.     FINAL CLINICAL IMPRESSION(S) / ED DIAGNOSES   Final diagnoses:  Pruritus    Note:  This document was prepared using Dragon voice recognition software and may include unintentional dictation errors.    Floy Roberts, MD 03/05/23 [phone]

## 2023-03-05 NOTE — BH Assessment (Signed)
 TTS unable to complete consult at this time. Patient is unable to participate in the interview.

## 2023-03-05 NOTE — ED Notes (Signed)
 Care manager/friend [name],Shaylaya (Friend) [phone] (Mobile)  brought to hospital.  She stated that she lives locally and would be avail to p/u @ d/c. Ask that we call when ready

## 2023-03-05 NOTE — ED Triage Notes (Addendum)
 Patient presents from boarding house with peer support specialist for hallucinations. Reports last night around 11:30pm he started seeing bugs in his hair. Reports he took a pill last night but unsure what. Seen for same yesterday here at [facility]  Peer support specialist said she saw him around 3:30pm yesterday and he was normal. She also reports he was trying to run out into the street and is a danger to himself. [name] [phone]  History huntington's disease

## 2023-03-06 NOTE — ED Provider Notes (Signed)
 [facility] Provider Note   Event Date/Time   First MD Initiated Contact with Patient 03/05/23 1021     (approximate) History  Hallucinations  HPI Frank Fernandez is a 43 y.o. male with a stated past medical history of Huntington's disease, depression, and anxiety who presents after using cocaine  around 1130 last night.  Patient now is stating that he has bugs crawling in his hair and was attempting to run out into the street after meeting with his peers support specialist, [name].  Further history and review of systems are unable to be obtained at this time secondary patient's mental status ROS: Unable to assess   Physical Exam  Triage Vital Signs: ED Triage Vitals  Encounter Vitals Group     BP 03/05/23 1016 (!) 128/94     Systolic BP Percentile --      Diastolic BP Percentile --      Pulse Rate 03/05/23 1016 (!) 112     Resp 03/05/23 1016 18     Temp 03/05/23 1016 98.2 F (36.8 C)     Temp Source 03/05/23 1016 Oral     SpO2 03/05/23 1016 96 %     Weight 03/05/23 1008 175 lb (79.4 kg)     Height 03/05/23 1008 5' 11 (1.803 m)     Head Circumference --      Peak Flow --      Pain Score 03/05/23 1007 0     Pain Loc --      Pain Education --      Exclude from Growth Chart --    Most recent vital signs: Vitals:   03/05/23 1016 03/05/23 1936  BP: (!) 128/94 109/76  Pulse: (!) 112 64  Resp: 18 19  Temp: 98.2 F (36.8 C)   SpO2: 96% 97%   General: Awake, uncooperative CV:  Good peripheral perfusion.  Resp:  Normal effort.  Abd:  No distention.  Other:  Anxious well-developed, well-nourished African-American middle-age male in stretcher with constant movement and persistently asking to examine his head.  Patient has mild dandruff however there is no other discoloration, insects, or rash appreciated ED Results / Procedures / Treatments  Labs (all labs ordered are listed, but only abnormal results are displayed) Labs Reviewed   COMPREHENSIVE METABOLIC PANEL - Abnormal; Notable for the following components:      Result Value   Potassium 2.8 (*)    Glucose, Bld 176 (*)    BUN 30 (*)    All other components within normal limits  SALICYLATE LEVEL - Abnormal; Notable for the following components:   Salicylate Lvl <7.0 (*)    All other components within normal limits  ACETAMINOPHEN  LEVEL - Abnormal; Notable for the following components:   Acetaminophen  (Tylenol ), Serum <10 (*)    All other components within normal limits  URINE DRUG SCREEN, QUALITATIVE ([facility] ONLY) - Abnormal; Notable for the following components:   Cocaine  Metabolite,Ur [place] POSITIVE (*)    All other components within normal limits  ETHANOL  CBC  HEMOGLOBIN A1C   PROCEDURES: Critical Care performed: No Procedures MEDICATIONS ORDERED IN ED: Medications  LORazepam  (ATIVAN ) tablet 1 mg (1 mg Oral Given 03/05/23 1037)  OLANZapine  zydis (ZYPREXA ) disintegrating tablet 10 mg (10 mg Oral Given 03/05/23 1148)  LORazepam  (ATIVAN ) tablet 2 mg (2 mg Oral Given 03/05/23 1253)   IMPRESSION / MDM / ASSESSMENT AND PLAN / ED COURSE  I reviewed the triage vital signs and the nursing notes.  The patient is on the cardiac monitor to evaluate for evidence of arrhythmia and/or significant heart rate changes. Patient's presentation is most consistent with acute presentation with potential threat to life or bodily function. Patient presents under IVC for hallucinations. Thoughts are disorganized. No history of prior suicide attempt, and no SI or HI at this time. Clinically w/ no overt toxidrome, low suspicion for ingestion given hx and exam Thoughts unlikely 2/2 anemia, hypothyroidism, infection, or ICH. Patient's decision making capacity is compromised and they are unable to perform all ADL's (additionally they are without appropriate caretakers to assist through this deficit).  Consult: Psychiatry to evaluate patient for grave  disability Disposition: Pending psychiatric evaluation  Care of this patient will be signed out the oncoming physician.  All pertinent patient formation is conveyed and all questions answered.  All further care and disposition decisions will be made by the oncoming physician.   FINAL CLINICAL IMPRESSION(S) / ED DIAGNOSES   Final diagnoses:  Hallucinations  Huntington's disease (HCC)   Rx / DC Orders   ED Discharge Orders     None      Note:  This document was prepared using Dragon voice recognition software and may include unintentional dictation errors.   Jossie Artist POUR, MD 03/06/23 [phone]

## 2023-03-07 ENCOUNTER — Other Ambulatory Visit: Payer: Self-pay

## 2023-03-07 ENCOUNTER — Emergency Department
Admission: EM | Admit: 2023-03-07 | Discharge: 2023-03-08 | Disposition: A | Discharge status: Home / Self Care | Payer: 59 | Attending: Emergency Medicine | Admitting: Emergency Medicine

## 2023-03-07 DIAGNOSIS — R45851 Suicidal ideations: Secondary | ICD-10-CM | POA: Diagnosis not present

## 2023-03-07 DIAGNOSIS — R441 Visual hallucinations: Secondary | ICD-10-CM | POA: Diagnosis present

## 2023-03-07 DIAGNOSIS — F331 Major depressive disorder, recurrent, moderate: Secondary | ICD-10-CM | POA: Diagnosis not present

## 2023-03-07 LAB — COMPREHENSIVE METABOLIC PANEL
ALT: 29 U/L (ref 0–44)
AST: 29 U/L (ref 15–41)
Albumin: 4.4 g/dL (ref 3.5–5.0)
Alkaline Phosphatase: 70 U/L (ref 38–126)
Anion gap: 13 (ref 5–15)
BUN: 22 mg/dL — ABNORMAL HIGH (ref 6–20)
CO2: 25 mmol/L (ref 22–32)
Calcium: 9.2 mg/dL (ref 8.9–10.3)
Chloride: 103 mmol/L (ref 98–111)
Creatinine, Ser: 0.86 mg/dL (ref 0.61–1.24)
GFR, Estimated: >60 mL/min (ref >60)
Glucose, Bld: 137 mg/dL — ABNORMAL HIGH (ref 70–99)
Potassium: 3.4 mmol/L — ABNORMAL LOW (ref 3.5–5.1)
Sodium: 141 mmol/L (ref 135–145)
Total Bilirubin: 0.9 mg/dL (ref 0.0–1.2)
Total Protein: 7.7 g/dL (ref 6.5–8.1)

## 2023-03-07 LAB — URINE DRUG SCREEN, QUALITATIVE (ARMC ONLY)
Amphetamines, Ur Screen: NOT DETECTED
Barbiturates, Ur Screen: NOT DETECTED
Benzodiazepine, Ur Scrn: NOT DETECTED
Cannabinoid 50 Ng, Ur ~~LOC~~: NOT DETECTED
Cocaine Metabolite,Ur ~~LOC~~: POSITIVE — AB
MDMA (Ecstasy)Ur Screen: NOT DETECTED
Methadone Scn, Ur: NOT DETECTED
Opiate, Ur Screen: NOT DETECTED
Phencyclidine (PCP) Ur S: NOT DETECTED
Tricyclic, Ur Screen: NOT DETECTED

## 2023-03-07 LAB — CBC
HCT: 47.7 % (ref 39.0–52.0)
Hemoglobin: 15.9 g/dL (ref 13.0–17.0)
MCH: 30.6 pg (ref 26.0–34.0)
MCHC: 33.3 g/dL (ref 30.0–36.0)
MCV: 91.9 fL (ref 80.0–100.0)
Platelets: 237 10*3/uL (ref 150–400)
RBC: 5.19 MIL/uL (ref 4.22–5.81)
RDW: 12.7 % (ref 11.5–15.5)
WBC: 9 10*3/uL (ref 4.0–10.5)
nRBC: 0 % (ref 0.0–0.2)

## 2023-03-07 LAB — SALICYLATE LEVEL: Salicylate Lvl: <7 mg/dL — ABNORMAL LOW (ref 7.0–30.0)

## 2023-03-07 LAB — ETHANOL: Alcohol, Ethyl (B): <10 mg/dL (ref <10)

## 2023-03-07 LAB — ACETAMINOPHEN LEVEL: Acetaminophen (Tylenol), Serum: <10 ug/mL — ABNORMAL LOW (ref 10–30)

## 2023-03-07 NOTE — ED Triage Notes (Addendum)
 Pt arrived via [facility] for visual hallucinations. Pt sts that he is infested with warms and cut his pubic hair due to the warms. Pt sts that he recently did cocaine  4 days ago. Pt does have rapid movement in triage however pt has a hx of huntington disease.

## 2023-03-07 NOTE — ED Notes (Signed)
 Pt given snack and beverage.

## 2023-03-07 NOTE — ED Provider Triage Note (Signed)
 Emergency Medicine Provider Triage Evaluation Note  [facility] [place] , a 43 y.o. male  was evaluated in triage.  Pt complains of complaints of having bugs on him. Reports he shaved his pubic hair due to having worms on him. History of cocaine  use and reports last use was 4-5 days ago.   Hx of huntington's, depression and anxiety.   Review of Systems  Positive: hallucinations Negative: SI, HI  Physical Exam  There were no vitals taken for this visit. Gen:   Awake, no distress   Resp:  Normal effort  MSK:   Moves extremities without difficulty  Other:  No worms visualized at this time  Medical Decision Making  Medically screening exam initiated at 6:46 PM.  Appropriate orders placed.  Eamon Dijon Oelschlager was informed that the remainder of the evaluation will be completed by another provider, this initial triage assessment does not replace that evaluation, and the importance of remaining in the ED until their evaluation is complete.     Phyliss Breen, PA-C 03/07/23 1849

## 2023-03-07 NOTE — ED Triage Notes (Signed)
 First nurse note: Pt to ED via [facility] from [address] Pt here voluntarily for hallucinations and SI.   Support specialist [phone]

## 2023-03-07 NOTE — ED Provider Notes (Signed)
   [facility] Provider Note    Event Date/Time   First MD Initiated Contact with Patient 03/07/23 1906     (approximate)  History   Chief Complaint: Mental Health Problem  HPI  Frank Fernandez is a 43 y.o. male with a past medical history of substance abuse, Huntington's disease, depression, presents to the emergency department with suicidal ideation and visual hallucinations.  According to the patient he states he has been seeing worms crawling on his body over the last few days.  Patient states he has been having thoughts of killing himself.  Patient mid to the triage nurse last using cocaine  approximately 4 days ago.  Physical Exam   Triage Vital Signs: ED Triage Vitals  Encounter Vitals Group     BP 03/07/23 1847 (!) 128/99     Systolic BP Percentile --      Diastolic BP Percentile --      Pulse Rate 03/07/23 1847 79     Resp 03/07/23 1847 18     Temp 03/07/23 1847 98.3 F (36.8 C)     Temp Source 03/07/23 1847 Oral     SpO2 03/07/23 1847 97 %     Weight 03/07/23 1852 165 lb (74.8 kg)     Height 03/07/23 1852 5\' 11"  (1.803 m)     Head Circumference --      Peak Flow --      Pain Score 03/07/23 1852 0     Pain Loc --      Pain Education --      Exclude from Growth Chart --     Most recent vital signs: Vitals:   03/07/23 1847  BP: (!) 128/99  Pulse: 79  Resp: 18  Temp: 98.3 F (36.8 C)  SpO2: 97%    General: Awake, no distress.  Jerking like movements likely related to Huntington's. CV:  Good peripheral perfusion.  Regular rate and rhythm  Resp:  Normal effort.  Equal breath sounds bilaterally.  Abd:  No distention.  Soft, nontender.  No rebound or guarding.  ED Results / Procedures / Treatments   MEDICATIONS ORDERED IN ED: Medications - No data to display   IMPRESSION / MDM / ASSESSMENT AND PLAN / ED COURSE  I reviewed the triage vital signs and the nursing notes.  Patient's presentation is most consistent with acute  presentation with potential threat to life or bodily function.  Patient presents to the emergency department with thoughts of killing himself as well as visual hallucinations of worms crawling on his skin.  No warmth seen on my evaluation.  Patient CBC and chemistry are reassuring.  Given the patient's suicidal ideation we will place patient under an IVC order until psychiatry can adequately evaluate.  Patient did admit to the triage nurse using cocaine  4 days ago.  We will perform a urine drug screen as well.  FINAL CLINICAL IMPRESSION(S) / ED DIAGNOSES   Suicidal ideation   Note:  This document was prepared using Dragon voice recognition software and may include unintentional dictation errors.   Ruth Cove, MD 03/07/23 [phone]

## 2023-03-07 NOTE — BH Assessment (Signed)
 Comprehensive Clinical Assessment (CCA) Screening, Triage and Referral Note  03/07/2023 Frank Fernandez [id number] Recommendations for Services/Supports/Treatments: Disposition pending. Barbra Ley. Sickles is a 43 year old, Black, Non-Hispanic ENGLISH-speaking male with a psych hx of depression and Huntington's Disease. Pt under IVC. Per triage note: Pt arrived via BPD for visual hallucinations. Pt sts that he is infested with warms and cut his pubic hair due to the warms. Pt sts that he recently did cocaine  4 days ago. Pt does have rapid movement in triage however pt has a hx of Huntington disease.  On assessment, pt. was anxious, but cooperative. Pt had a pleasant attitude towards this Clinical research associate. The pt. reported that denied having depression, anxiety, or SI. The patient reported that he lives alone.  The pt. explained that he'd gotten overwhelmed due to having worms all over his body, which resulted in him having to cut his hair on his private. Pt had poor insight/judgment. Pt had slurred speech and thoughts were linear. Pt was oriented x4. Pt presented with agitated/restless psychomotor activity, with occasional tics. Pt denied current SI, HI, and AV/H. Pt reported using ecstacy (MDMA) about 1 week ago. After learning about his UDS results, the pt admitted to using cocaine  about 3-4 days ago. Pt denied alcohol use.  Chief Complaint:  Chief Complaint  Patient presents with   Mental Health Problem   Visit Diagnosis: 1.  parasitosis 2.  Huntington's 3.  Cocaine  abuse  Patient Reported Information How did you hear about us ? Self  What Is the Reason for Your Visit/Call Today? Patient brought to the ER via his Peer Support worker, due to the belief bugs are crawling on him.  How Long Has This Been Causing You Problems? 1 wk - 1 month  What Do You Feel Would Help You the Most Today? Treatment for Depression or other mood problem; Alcohol or Drug Use Treatment   Have You Recently Had Any  Thoughts About Hurting Yourself? No  Are You Planning to Commit Suicide/Harm Yourself At This time? No   Have you Recently Had Thoughts About Hurting Someone Marigene Shoulder? No  Are You Planning to Harm Someone at This Time? No  Explanation: No data recorded  Have You Used Any Alcohol or Drugs in the Past 24 Hours? No (Patient denies use, but UDS is positive for cocaine .)  How Long Ago Did You Use Drugs or Alcohol? No data recorded What Did You Use and How Much? No data recorded  Do You Currently Have a Therapist/Psychiatrist? Yes  Name of Therapist/Psychiatrist: Surveyor, quantity.   Have You Been Recently Discharged From Any Office Practice or Programs? No  Explanation of Discharge From Practice/Program: No data recorded   CCA Screening Triage Referral Assessment Type of Contact: Face-to-Face  Telemedicine Service Delivery:   Is this Initial or Reassessment?   Date Telepsych consult ordered in [facility]:    Time Telepsych consult ordered in [facility]:    Location of Assessment[facility] ED  Provider Location: [facility] ED    Collateral Involvement: No data recorded  Does Patient Have a Court Appointed Legal Guardian? No data recorded Name and Contact of Legal Guardian: No data recorded If Minor and Not Living with Parent(s), Who has Custody? No data recorded Is CPS involved or ever been involved? Never  Is APS involved or ever been involved? Never   Patient Determined To Be At Risk for Harm To Self or Others Based on Review of Patient Reported Information or Presenting Complaint? No  Method: No data recorded Availability  of Means: No data recorded Intent: No data recorded Notification Required: No data recorded Additional Information for Danger to Others Potential: No data recorded Additional Comments for Danger to Others Potential: No data recorded Are There Guns or Other Weapons in Your Home? No data recorded Types of Guns/Weapons: No data recorded Are These Weapons Safely Secured?                             No data recorded Who Could Verify You Are Able To Have These Secured: No data recorded Do You Have any Outstanding Charges, Pending Court Dates, Parole/Probation? No data recorded Contacted To Inform of Risk of Harm To Self or Others: No data recorded  Does Patient Present under Involuntary Commitment? No    [place] of Residence: [place]   Patient Currently Receiving the Following Services: Peer Support Services   Determination of Need: Emergent (2 hours)   Options For Referral: ED Visit   Disposition Recommendation per psychiatric provider:   Abagael Kramm R Tanny Harnack, LCAS

## 2023-03-07 NOTE — ED Notes (Signed)
 ivc by MD Paduchowski/psych consult ordered/pending.

## 2023-03-07 NOTE — ED Notes (Addendum)
 ENVIRONMENTAL ASSESSMENT Potentially harmful objects out of patient reach: Yes.   Personal belongings secured: Yes.   Patient dressed in hospital provided attire only: Yes.   Plastic bags out of patient reach: Yes.   Patient care equipment (cords, cables, call bells, lines, and drains) shortened, removed, or accounted for: Yes.   Equipment and supplies removed from bottom of stretcher: Yes.   Potentially toxic materials out of patient reach: Yes.   Sharps container removed or out of patient reach: Yes.

## 2023-03-07 NOTE — ED Notes (Signed)
 Phone Lighter Jeans Black underwear Black slide shoes Black socks Green shirt

## 2023-03-08 DIAGNOSIS — F331 Major depressive disorder, recurrent, moderate: Secondary | ICD-10-CM | POA: Diagnosis not present

## 2023-03-08 DIAGNOSIS — F1410 Cocaine abuse, uncomplicated: Secondary | ICD-10-CM

## 2023-03-08 DIAGNOSIS — G10 Huntington's disease: Secondary | ICD-10-CM | POA: Diagnosis not present

## 2023-03-08 DIAGNOSIS — F22 Delusional disorders: Secondary | ICD-10-CM | POA: Diagnosis not present

## 2023-03-08 DIAGNOSIS — R45851 Suicidal ideations: Secondary | ICD-10-CM | POA: Diagnosis not present

## 2023-03-08 DIAGNOSIS — R441 Visual hallucinations: Secondary | ICD-10-CM | POA: Diagnosis not present

## 2023-03-08 MED ORDER — HYDROXYZINE HCL 25 MG PO TABS
50.0000 mg | ORAL_TABLET | Freq: Once | ORAL | Status: AC
  Administered 2023-03-08: 50 mg via ORAL
  Filled 2023-03-08: qty 2

## 2023-03-08 NOTE — ED Provider Notes (Addendum)
-----------------------------------------   12:30 AM on 03/08/2023 -----------------------------------------   Patient evaluated by tele-psychiatrist who deems patient psychiatrically stable for discharge home.  Will resend IVC once patient has secured ride/safe discharge home.  ----------------------------------------- 5:43 AM on 03/08/2023 -----------------------------------------   Patient unable to find a ride home.  Once he is able to, his IVC may be rescinded and patient may be discharged.   Irean Hong, MD 03/08/23 0543   ----------------------------------------- 6:46 AM on 03/08/2023 -----------------------------------------   Patient was able to find a friend to pick him up around 7:30 AM.  IVC rescinded.   Irean Hong, MD 03/08/23 [phone]

## 2023-03-08 NOTE — ED Notes (Signed)
Patient's friend Jamelle Haring called.  Writer asked if he would be able to pick up patient when discharged. Mr. Jamelle Haring states yes. He would be here around 0730. EDP Dr. Dolores Frame made aware..  Advised [name] to call when he is here at the ER and we would walk patient out. He agreed to plan.
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
## 2023-03-08 NOTE — ED Notes (Signed)
Vol/consult done/pending disposition

## 2023-03-08 NOTE — Discharge Instructions (Signed)
Return to the ER for worsening symptoms, feelings of hurting yourself or others, or other concerns.

## 2023-03-10 ENCOUNTER — Telehealth: Payer: Self-pay | Admitting: Cardiology

## 2023-03-10 NOTE — Telephone Encounter (Signed)
Pt keeps calling saying he is alone at home & stepped outside to smoke & now has worms all over him. He recently was in the hospital, so what I was trying to do was get him scheudled for a hospital follow up. But everytime I do so he says he doesn't have a ride, asks for a script over the phone & then hangs up

## 2023-03-15 ENCOUNTER — Ambulatory Visit: Payer: 59 | Admitting: Cardiology

## 2023-03-15 ENCOUNTER — Encounter: Payer: Self-pay | Admitting: Emergency Medicine

## 2023-03-15 ENCOUNTER — Emergency Department
Admission: EM | Admit: 2023-03-15 | Discharge: 2023-03-17 | Disposition: A | Discharge status: Other Acute Inpatient Hospital | Payer: 59 | Attending: Emergency Medicine | Admitting: Emergency Medicine

## 2023-03-15 ENCOUNTER — Emergency Department: Payer: 59

## 2023-03-15 DIAGNOSIS — R079 Chest pain, unspecified: Secondary | ICD-10-CM | POA: Diagnosis not present

## 2023-03-15 DIAGNOSIS — G10 Huntington's disease: Secondary | ICD-10-CM | POA: Diagnosis not present

## 2023-03-15 DIAGNOSIS — R443 Hallucinations, unspecified: Secondary | ICD-10-CM | POA: Insufficient documentation

## 2023-03-15 DIAGNOSIS — G255 Other chorea: Secondary | ICD-10-CM | POA: Diagnosis not present

## 2023-03-15 DIAGNOSIS — R0602 Shortness of breath: Secondary | ICD-10-CM | POA: Diagnosis not present

## 2023-03-15 DIAGNOSIS — Z743 Need for continuous supervision: Secondary | ICD-10-CM | POA: Diagnosis not present

## 2023-03-15 DIAGNOSIS — F29 Unspecified psychosis not due to a substance or known physiological condition: Secondary | ICD-10-CM | POA: Diagnosis not present

## 2023-03-15 DIAGNOSIS — I10 Essential (primary) hypertension: Secondary | ICD-10-CM | POA: Diagnosis not present

## 2023-03-15 DIAGNOSIS — R000 Tachycardia, unspecified: Secondary | ICD-10-CM | POA: Diagnosis not present

## 2023-03-15 DIAGNOSIS — F22 Delusional disorders: Secondary | ICD-10-CM | POA: Diagnosis not present

## 2023-03-15 DIAGNOSIS — R9431 Abnormal electrocardiogram [ECG] [EKG]: Secondary | ICD-10-CM | POA: Diagnosis not present

## 2023-03-15 LAB — COMPREHENSIVE METABOLIC PANEL
ALT: 26 U/L (ref 0–44)
AST: 27 U/L (ref 15–41)
Albumin: 3.9 g/dL (ref 3.5–5.0)
Alkaline Phosphatase: 76 U/L (ref 38–126)
Anion gap: 11 (ref 5–15)
BUN: 37 mg/dL — ABNORMAL HIGH (ref 6–20)
CO2: 24 mmol/L (ref 22–32)
Calcium: 8.9 mg/dL (ref 8.9–10.3)
Chloride: 105 mmol/L (ref 98–111)
Creatinine, Ser: 0.95 mg/dL (ref 0.61–1.24)
GFR, Estimated: >60 mL/min (ref >60)
Glucose, Bld: 105 mg/dL — ABNORMAL HIGH (ref 70–99)
Potassium: 3.7 mmol/L (ref 3.5–5.1)
Sodium: 140 mmol/L (ref 135–145)
Total Bilirubin: 0.8 mg/dL (ref 0.0–1.2)
Total Protein: 7.3 g/dL (ref 6.5–8.1)

## 2023-03-15 LAB — URINE DRUG SCREEN, QUALITATIVE (ARMC ONLY)
Amphetamines, Ur Screen: NOT DETECTED
Barbiturates, Ur Screen: NOT DETECTED
Benzodiazepine, Ur Scrn: NOT DETECTED
Cannabinoid 50 Ng, Ur ~~LOC~~: NOT DETECTED
Cocaine Metabolite,Ur ~~LOC~~: NOT DETECTED
MDMA (Ecstasy)Ur Screen: NOT DETECTED
Methadone Scn, Ur: NOT DETECTED
Opiate, Ur Screen: NOT DETECTED
Phencyclidine (PCP) Ur S: NOT DETECTED
Tricyclic, Ur Screen: NOT DETECTED

## 2023-03-15 LAB — TROPONIN I (HIGH SENSITIVITY): Troponin I (High Sensitivity): 14 ng/L (ref <18)

## 2023-03-15 LAB — CBC WITH DIFFERENTIAL/PLATELET
Abs Immature Granulocytes: 0.04 10*3/uL (ref 0.00–0.07)
Basophils Absolute: 0 10*3/uL (ref 0.0–0.1)
Basophils Relative: 0 %
Eosinophils Absolute: 0.1 10*3/uL (ref 0.0–0.5)
Eosinophils Relative: 1 %
HCT: 41.7 % (ref 39.0–52.0)
Hemoglobin: 14.1 g/dL (ref 13.0–17.0)
Immature Granulocytes: 0 %
Lymphocytes Relative: 12 %
Lymphs Abs: 1.5 10*3/uL (ref 0.7–4.0)
MCH: 30.9 pg (ref 26.0–34.0)
MCHC: 33.8 g/dL (ref 30.0–36.0)
MCV: 91.2 fL (ref 80.0–100.0)
Monocytes Absolute: 1.4 10*3/uL — ABNORMAL HIGH (ref 0.1–1.0)
Monocytes Relative: 11 %
Neutro Abs: 9.9 10*3/uL — ABNORMAL HIGH (ref 1.7–7.7)
Neutrophils Relative %: 76 %
Platelets: 211 10*3/uL (ref 150–400)
RBC: 4.57 MIL/uL (ref 4.22–5.81)
RDW: 12.3 % (ref 11.5–15.5)
WBC: 12.9 10*3/uL — ABNORMAL HIGH (ref 4.0–10.5)
nRBC: 0 % (ref 0.0–0.2)

## 2023-03-15 LAB — SALICYLATE LEVEL: Salicylate Lvl: <7 mg/dL — ABNORMAL LOW (ref 7.0–30.0)

## 2023-03-15 LAB — ACETAMINOPHEN LEVEL: Acetaminophen (Tylenol), Serum: <10 ug/mL — ABNORMAL LOW (ref 10–30)

## 2023-03-15 MED ORDER — LORAZEPAM 2 MG/ML IJ SOLN
1.0000 mg | Freq: Once | INTRAMUSCULAR | Status: AC
  Administered 2023-03-15: 1 mg via INTRAVENOUS
  Filled 2023-03-15: qty 1

## 2023-03-15 MED ORDER — DULOXETINE HCL 60 MG PO CPEP
60.0000 mg | ORAL_CAPSULE | Freq: Every day | ORAL | Status: DC
  Administered 2023-03-15 – 2023-03-17 (×3): 60 mg via ORAL
  Filled 2023-03-15 (×3): qty 1

## 2023-03-15 NOTE — ED Notes (Signed)
Pt continues to be very restless. Bed alarm in place. Fall bracelet on . Pt took off no slip socks.

## 2023-03-15 NOTE — ED Notes (Signed)
 Hospital meal provided, pt tolerated w/o complaints.  Waste discarded appropriately.

## 2023-03-15 NOTE — ED Notes (Signed)
Tele sitter now with pt

## 2023-03-15 NOTE — ED Notes (Signed)
Sandwich tray given to pt.

## 2023-03-15 NOTE — ED Notes (Signed)
Pt repeatedly taking off monitors.

## 2023-03-15 NOTE — BH Assessment (Signed)
 Adult MH  Referral information for Psychiatric Hospitalization faxed to:   [facility] [phone]- [phone]),   [address] [phone]),   [facility] [phone] -or- [phone]),   [facility] [phone]),   [facility] [phone] or [phone])   [facility] [phone] or [phone]),   [facility] [phone]).

## 2023-03-15 NOTE — ED Notes (Signed)
 Pt up to bathroom.

## 2023-03-15 NOTE — ED Provider Notes (Addendum)
[facility] Provider Note    Event Date/Time   First MD Initiated Contact with Patient 03/15/23 [phone]     (approximate)   History   Chief Complaint Hallucinations   HPI  Frank Fernandez is a 43 y.o. male with past medical history of Huntington's disease and chorea who presents to the ED complaining of hallucinations.  EMS reports that patient called out due to having "worms" across his body, including one that crawled into his neck.  He states "I have a hole in my neck and something crawled in there."  Patient with frequent abnormal movements per EMS with erratic behavior during transport.  Patient reports that he is having difficulty breathing and had some pain in his chest earlier, denies any fevers or cough.     Physical Exam   Triage Vital Signs: ED Triage Vitals  Encounter Vitals Group     BP      Systolic BP Percentile      Diastolic BP Percentile      Pulse      Resp      Temp      Temp src      SpO2      Weight      Height      Head Circumference      Peak Flow      Pain Score      Pain Loc      Pain Education      Exclude from Growth Chart     Most recent vital signs: Vitals:   03/15/23 0331  BP: 137/80  Pulse: 95  Resp: (!) 24  Temp: 98.9 F (37.2 C)  SpO2: 99%    Constitutional: Alert and oriented. Eyes: Conjunctivae are normal. Head: Atraumatic. Nose: No congestion/rhinnorhea. Mouth/Throat: Mucous membranes are moist.  Neck: No erythema, edema, warmth, or tenderness noted.  No skin lesions noted. Cardiovascular: Normal rate, regular rhythm. Grossly normal heart sounds.  2+ radial pulses bilaterally. Respiratory: Normal respiratory effort.  No retractions. Lungs CTAB. Gastrointestinal: Soft and nontender. No distention. Musculoskeletal: No lower extremity tenderness nor edema.  Numerous excoriations to bilateral chest with no erythema, edema, warmth, or drainage. Neurologic:  Normal speech and language. No gross  focal neurologic deficits are appreciated.    ED Results / Procedures / Treatments   Labs (all labs ordered are listed, but only abnormal results are displayed) Labs Reviewed  CBC WITH DIFFERENTIAL/PLATELET - Abnormal; Notable for the following components:      Result Value   WBC 12.9 (*)    Neutro Abs 9.9 (*)    Monocytes Absolute 1.4 (*)    All other components within normal limits  COMPREHENSIVE METABOLIC PANEL - Abnormal; Notable for the following components:   Glucose, Bld 105 (*)    BUN 37 (*)    All other components within normal limits  ACETAMINOPHEN LEVEL - Abnormal; Notable for the following components:   Acetaminophen (Tylenol), Serum <10 (*)    All other components within normal limits  SALICYLATE LEVEL - Abnormal; Notable for the following components:   Salicylate Lvl <7.0 (*)    All other components within normal limits  URINE DRUG SCREEN, QUALITATIVE ([facility] ONLY)  TROPONIN I (HIGH SENSITIVITY)   ED ECG REPORT I, Chesley Noon, the attending physician, personally viewed and interpreted this ECG.   Date: 03/15/2023  EKG Time: 5:03  Rate: 83  Rhythm: normal sinus rhythm  Axis: Normal  Intervals:none  ST&T Change: None  RADIOLOGY Chest x-ray reviewed and interpreted by me with no infiltrate, edema, or effusion.  PROCEDURES:  Critical Care performed: No  Procedures   MEDICATIONS ORDERED IN ED: Medications  LORazepam (ATIVAN) injection 1 mg (1 mg Intravenous Given 03/15/23 0346)  LORazepam (ATIVAN) injection 1 mg (1 mg Intravenous Given 03/15/23 0438)     IMPRESSION / MDM / ASSESSMENT AND PLAN / ED COURSE  I reviewed the triage vital signs and the nursing notes.                              43 y.o. male with past medical history of Huntington's disease and chorea who presents to the ED complaining of worms under his skin with some chest pain and difficulty breathing.  Patient's presentation is most consistent with acute presentation with  potential threat to life or bodily function.  Differential diagnosis includes, but is not limited to, arrhythmia, ACS, anxiety, psychosis, pneumonia, pneumothorax, musculoskeletal pain, anemia, electrolyte abnormality, AKI, substance abuse.  Patient nontoxic-appearing and in no acute distress, vital signs are unremarkable.  He does have frequent abnormal movements consistent with known tics and chorea, although seems to be increased in frequency and severity from when I have seen him in the past.  He has numerous excoriations to his chest where he appears to have been picking at his skin, no signs of infection to chest or neck.  He has a known history of delusional parasitosis, evaluated multiple times for this in the past 2 weeks.  He does complain of chest pain and shortness of breath today, we will screen EKG, labs, chest x-ray.  He would benefit from reevaluation by psychiatry, will give dose of IV Ativan for now.  Labs are reassuring with no significant anemia, leukocytosis, electrolyte abnormality, or AKI.  Troponin within normal limits and chest x-ray is also unremarkable.  LFTs within normal limits, Tylenol and salicylate levels are undetectable.  Patient may be medically cleared for psychiatric disposition, he is slightly calmer following IV Ativan.  The patient has been placed in psychiatric observation due to the need to provide a safe environment for the patient while obtaining psychiatric consultation and evaluation, as well as ongoing medical and medication management to treat the patient's condition.  The patient has not been placed under full IVC at this time.       FINAL CLINICAL IMPRESSION(S) / ED DIAGNOSES   Final diagnoses:  Delusion (HCC)  Huntington disease (HCC)     Rx / DC Orders   ED Discharge Orders     None        Note:  This document was prepared using Dragon voice recognition software and may include unintentional dictation errors.   Chesley Noon,  MD 03/15/23 [id number]    Chesley Noon, MD 03/15/23 [phone]

## 2023-03-15 NOTE — ED Notes (Signed)
Resumed care from vet rn.  Pt eating dinner meal   sitter with pt.

## 2023-03-15 NOTE — ED Notes (Signed)
Patient sitting in bed and fidgeting. Pt pulled iv out while this RN was receiving morning report.

## 2023-03-15 NOTE — ED Notes (Signed)
Pt ripped IV out, bleeding controlled and cleaned, pt's arm is bandaged.

## 2023-03-15 NOTE — ED Notes (Signed)
Unable to obtain EKG at this time d/t pt excessive movement. Dr Larinda Buttery aware

## 2023-03-15 NOTE — ED Notes (Signed)
Will attempt EKG when pt able to be still. Dr Larinda Buttery aware

## 2023-03-15 NOTE — ED Notes (Signed)
Belongings: Blue shoes Gray pants [employer] Cell phone [employer] boxers

## 2023-03-15 NOTE — ED Notes (Signed)
Pt continues to have excessive movement. Notified that need urine sample

## 2023-03-15 NOTE — ED Notes (Addendum)
Pt [facility] ED [facility] from [facility], called EMS for having hole in throat that water comes out of when drinking and bug in throat. No hole visible. Pt very hyperactive. Denies SI
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
## 2023-03-16 DIAGNOSIS — F22 Delusional disorders: Secondary | ICD-10-CM

## 2023-03-16 DIAGNOSIS — G10 Huntington's disease: Secondary | ICD-10-CM | POA: Diagnosis not present

## 2023-03-16 DIAGNOSIS — G255 Other chorea: Secondary | ICD-10-CM | POA: Diagnosis not present

## 2023-03-16 MED ORDER — HYDROXYZINE HCL 25 MG PO TABS
50.0000 mg | ORAL_TABLET | Freq: Once | ORAL | Status: AC
  Administered 2023-03-16: 50 mg via ORAL
  Filled 2023-03-16: qty 2

## 2023-03-16 MED ORDER — ACETAMINOPHEN 500 MG PO TABS
1000.0000 mg | ORAL_TABLET | Freq: Once | ORAL | Status: AC
  Administered 2023-03-16: 1000 mg via ORAL
  Filled 2023-03-16: qty 2

## 2023-03-16 MED ORDER — QUETIAPINE FUMARATE 25 MG PO TABS
25.0000 mg | ORAL_TABLET | Freq: Every day | ORAL | Status: DC
Start: 2023-03-16 — End: 2023-03-17
  Administered 2023-03-16: 25 mg via ORAL
  Filled 2023-03-16: qty 1

## 2023-03-16 MED ORDER — HYDROXYZINE HCL 25 MG PO TABS
25.0000 mg | ORAL_TABLET | Freq: Three times a day (TID) | ORAL | Status: DC | PRN
  Administered 2023-03-16 – 2023-03-17 (×3): 25 mg via ORAL
  Filled 2023-03-16 (×3): qty 1

## 2023-03-16 NOTE — ED Notes (Signed)
 Patient is eating lunch.

## 2023-03-16 NOTE — ED Provider Notes (Signed)
Emergency Medicine Observation Re-evaluation Note  Frank Fernandez is a 43 y.o. male, seen on rounds today.  Pt initially presented to the ED for complaints of Hallucinations Currently, the patient is resting, voices no medical complaints.  Physical Exam  BP (!) 205/156 (BP Location: Right Arm)   Pulse 88   Temp 98.1 F (36.7 C) (Oral)   Resp (!) 24   Ht 5\' 11"  (1.803 m)   Wt 68 kg   SpO2 96%   BMI 20.92 kg/m  Physical Exam General: Resting in no acute distress Cardiac: No cyanosis Lungs: Equal rise and fall Psych: Not agitated  ED Course / MDM  EKG:EKG Interpretation Date/Time:  Tuesday March 15 2023 05:03:38 EST Ventricular Rate:  83 PR Interval:  162 QRS Duration:  92 QT Interval:  399 QTC Calculation: 469 R Axis:   56  Text Interpretation: Sinus rhythm ST elev, probable normal early repol pattern Confirmed by UNCONFIRMED, DOCTOR ([id number]), editor Lonell Face [phone]) on 03/15/2023 8:23:01 AM  I have reviewed the labs performed to date as well as medications administered while in observation.  Recent changes in the last 24 hours include no events overnight.  Plan  Current plan is for psychiatric disposition.    Frank Hong, MD 03/16/23 [phone]

## 2023-03-16 NOTE — ED Notes (Addendum)
Assumed care of patient. Patient is laying in bed. Sheets and covers were on the floor. New linens were provided for the patient. Patient also asked for something to help him sleep. Request sent to MD. Patient now has a 1-1 sitter, due to being a high fall risk. Bed alarm, non-slip socks and wrist band are in place. Door is also open.

## 2023-03-16 NOTE — ED Notes (Signed)
 Pt given snack and beverage.

## 2023-03-16 NOTE — ED Notes (Signed)
Pt placed in hospital bed with bed alarm on.

## 2023-03-16 NOTE — BH Assessment (Signed)
PATIENT BED AVAILABLE AFTER 11AM ON 03/17/23  Patient has been accepted to [facility].  Patient assigned to [facility] Accepting physician is Dr. Sofie Hartigan.  Call report to [phone] option 2.  Representative was [employer].   ER Staff is aware of it:  [facility] ER Secretary  Dr. Modesto Charon, ER MD  [facility] Patient's Nurse

## 2023-03-16 NOTE — ED Notes (Signed)
Pt out of bed, reports needs that he needs help with the bugs crawling on him. No bugs visible. Pt assisted back to bed.
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
## 2023-03-16 NOTE — BH Assessment (Signed)
 Adult MH  Referral information for Psychiatric Hospitalization faxed to:   [facility] [phone]- [phone]),   [address] [phone]),   [facility] [phone] -or- [phone]),   [facility] [phone]),   [facility] [phone] or [phone])   [facility] [phone] or [phone]),   [facility] [phone]).

## 2023-03-16 NOTE — ED Notes (Signed)
Patient is up eating breakfast. Frank Fernandez is at bedside.

## 2023-03-16 NOTE — ED Notes (Signed)
Pt refusing to stay in bed. Charge RN aware and 1:1 sitter initiated.

## 2023-03-17 DIAGNOSIS — F22 Delusional disorders: Secondary | ICD-10-CM | POA: Diagnosis not present

## 2023-03-17 DIAGNOSIS — F329 Major depressive disorder, single episode, unspecified: Secondary | ICD-10-CM | POA: Diagnosis not present

## 2023-03-17 DIAGNOSIS — R079 Chest pain, unspecified: Secondary | ICD-10-CM | POA: Diagnosis not present

## 2023-03-17 DIAGNOSIS — R443 Hallucinations, unspecified: Secondary | ICD-10-CM | POA: Diagnosis not present

## 2023-03-17 DIAGNOSIS — F17210 Nicotine dependence, cigarettes, uncomplicated: Secondary | ICD-10-CM | POA: Diagnosis not present

## 2023-03-17 DIAGNOSIS — F4310 Post-traumatic stress disorder, unspecified: Secondary | ICD-10-CM | POA: Diagnosis not present

## 2023-03-17 DIAGNOSIS — F1290 Cannabis use, unspecified, uncomplicated: Secondary | ICD-10-CM | POA: Diagnosis not present

## 2023-03-17 DIAGNOSIS — G10 Huntington's disease: Secondary | ICD-10-CM | POA: Diagnosis not present

## 2023-03-17 DIAGNOSIS — F29 Unspecified psychosis not due to a substance or known physiological condition: Secondary | ICD-10-CM | POA: Diagnosis not present

## 2023-03-17 DIAGNOSIS — G255 Other chorea: Secondary | ICD-10-CM | POA: Diagnosis not present

## 2023-03-17 DIAGNOSIS — Z91148 Patient's other noncompliance with medication regimen for other reason: Secondary | ICD-10-CM | POA: Diagnosis not present

## 2023-03-17 DIAGNOSIS — T50906D Underdosing of unspecified drugs, medicaments and biological substances, subsequent encounter: Secondary | ICD-10-CM | POA: Diagnosis not present

## 2023-03-17 DIAGNOSIS — R0602 Shortness of breath: Secondary | ICD-10-CM | POA: Diagnosis not present

## 2023-03-17 DIAGNOSIS — Z62810 Personal history of physical and sexual abuse in childhood: Secondary | ICD-10-CM | POA: Diagnosis not present

## 2023-03-17 DIAGNOSIS — F14150 Cocaine abuse with cocaine-induced psychotic disorder with delusions: Secondary | ICD-10-CM | POA: Diagnosis not present

## 2023-03-17 MED ORDER — NICOTINE 14 MG/24HR TD PT24: 14.0000 mg | MEDICATED_PATCH | Freq: Every day | TRANSDERMAL | Status: DC

## 2023-03-17 MED ORDER — NICOTINE 14 MG/24HR TD PT24
14.0000 mg | MEDICATED_PATCH | Freq: Every day | TRANSDERMAL | Status: DC
  Administered 2023-03-17: 14 mg via TRANSDERMAL
  Filled 2023-03-17: qty 1

## 2023-03-17 NOTE — ED Provider Notes (Signed)
Emergency Medicine Observation Re-evaluation Note  Physical Exam   BP 111/74   Pulse 65   Temp 98.9 F (37.2 C)   Resp 18   Ht 5\' 11"  (1.803 m)   Wt 68 kg   SpO2 100%   BMI 20.92 kg/m   Patient appears in no acute distress.  ED Course / MDM   No reported events during my shift at the time of this note.   Pt is awaiting dispo from consultants   Pilar Jarvis MD    Pilar Jarvis, MD 03/17/23 [phone]

## 2023-03-17 NOTE — ED Notes (Addendum)
ED secretary informed this RN that pt as an available bed at [facility] in the morning.

## 2023-03-17 NOTE — ED Notes (Signed)
 EMTALA Reviewed by this RN.

## 2023-03-17 NOTE — ED Notes (Signed)
Pt & I ambulated in hall. I assisted pt by walking hand & hand. Pt tolerated walking well.

## 2023-03-28 ENCOUNTER — Ambulatory Visit (INDEPENDENT_AMBULATORY_CARE_PROVIDER_SITE_OTHER): Admitting: Cardiology

## 2023-03-28 ENCOUNTER — Encounter: Payer: Self-pay | Admitting: Cardiology

## 2023-03-28 VITALS — BP 104/62 | HR 103 | Ht 76.0 in | Wt 159.0 lb

## 2023-03-28 DIAGNOSIS — G4700 Insomnia, unspecified: Secondary | ICD-10-CM | POA: Insufficient documentation

## 2023-03-28 DIAGNOSIS — G4709 Other insomnia: Secondary | ICD-10-CM | POA: Diagnosis not present

## 2023-03-28 DIAGNOSIS — G10 Huntington's disease: Secondary | ICD-10-CM

## 2023-03-28 DIAGNOSIS — Z0130 Encounter for examination of blood pressure without abnormal findings: Secondary | ICD-10-CM

## 2023-03-28 MED ORDER — ARIPIPRAZOLE 30 MG PO TABS: 30.0000 mg | ORAL_TABLET | Freq: Every day | ORAL | 5 refills | Status: DC

## 2023-03-28 MED ORDER — TRAZODONE HCL 50 MG PO TABS: 50.0000 mg | ORAL_TABLET | Freq: Every day | ORAL | 3 refills | Status: DC

## 2023-03-28 MED ORDER — DULOXETINE HCL 60 MG PO CPEP: 60.0000 mg | ORAL_CAPSULE | Freq: Every day | ORAL | 5 refills | Status: DC
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
## 2023-04-12 ENCOUNTER — Ambulatory Visit: Payer: 59 | Admitting: Cardiology

## 2023-04-14 DIAGNOSIS — G8929 Other chronic pain: Secondary | ICD-10-CM | POA: Diagnosis not present

## 2023-04-14 DIAGNOSIS — M545 Low back pain, unspecified: Secondary | ICD-10-CM | POA: Diagnosis not present

## 2023-04-14 DIAGNOSIS — M542 Cervicalgia: Secondary | ICD-10-CM | POA: Diagnosis not present

## 2023-04-14 DIAGNOSIS — G10 Huntington's disease: Secondary | ICD-10-CM | POA: Diagnosis not present

## 2023-04-15 ENCOUNTER — Ambulatory Visit: Payer: 59 | Admitting: Cardiology

## 2023-04-25 ENCOUNTER — Encounter: Payer: Self-pay | Admitting: Cardiology

## 2023-04-25 ENCOUNTER — Ambulatory Visit (INDEPENDENT_AMBULATORY_CARE_PROVIDER_SITE_OTHER): Admitting: Cardiology

## 2023-04-25 VITALS — BP 122/66 | HR 63 | Ht 76.0 in | Wt 165.0 lb

## 2023-04-25 DIAGNOSIS — G4700 Insomnia, unspecified: Secondary | ICD-10-CM
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
## 2023-05-12 DIAGNOSIS — R269 Unspecified abnormalities of gait and mobility: Secondary | ICD-10-CM | POA: Diagnosis not present

## 2023-05-12 DIAGNOSIS — M542 Cervicalgia: Secondary | ICD-10-CM | POA: Diagnosis not present

## 2023-05-12 DIAGNOSIS — M6281 Muscle weakness (generalized): Secondary | ICD-10-CM | POA: Diagnosis not present

## 2023-05-12 DIAGNOSIS — M5459 Other low back pain: Secondary | ICD-10-CM | POA: Diagnosis not present

## 2023-05-17 DIAGNOSIS — M5459 Other low back pain: Secondary | ICD-10-CM | POA: Diagnosis not present

## 2023-05-17 DIAGNOSIS — M542 Cervicalgia: Secondary | ICD-10-CM | POA: Diagnosis not present

## 2023-05-17 DIAGNOSIS — M6281 Muscle weakness (generalized): Secondary | ICD-10-CM | POA: Diagnosis not present

## 2023-05-17 DIAGNOSIS — R269 Unspecified abnormalities of gait and mobility: Secondary | ICD-10-CM | POA: Diagnosis not present

## 2023-05-19 DIAGNOSIS — M6281 Muscle weakness (generalized): Secondary | ICD-10-CM | POA: Diagnosis not present

## 2023-05-19 DIAGNOSIS — M5459 Other low back pain: Secondary | ICD-10-CM | POA: Diagnosis not present

## 2023-05-19 DIAGNOSIS — R269 Unspecified abnormalities of gait and mobility: Secondary | ICD-10-CM | POA: Diagnosis not present

## 2023-05-19 DIAGNOSIS — M542 Cervicalgia: Secondary | ICD-10-CM | POA: Diagnosis not present

## 2023-05-23 DIAGNOSIS — M5459 Other low back pain: Secondary | ICD-10-CM | POA: Diagnosis not present

## 2023-05-23 DIAGNOSIS — R269 Unspecified abnormalities of gait and mobility: Secondary | ICD-10-CM | POA: Diagnosis not present

## 2023-05-23 DIAGNOSIS — M542 Cervicalgia: Secondary | ICD-10-CM | POA: Diagnosis not present

## 2023-05-23 DIAGNOSIS — M6281 Muscle weakness (generalized): Secondary | ICD-10-CM | POA: Diagnosis not present

## 2023-05-25 DIAGNOSIS — M542 Cervicalgia: Secondary | ICD-10-CM | POA: Diagnosis not present

## 2023-05-25 DIAGNOSIS — M5459 Other low back pain: Secondary | ICD-10-CM | POA: Diagnosis not present

## 2023-05-25 DIAGNOSIS — M6281 Muscle weakness (generalized): Secondary | ICD-10-CM | POA: Diagnosis not present

## 2023-05-25 DIAGNOSIS — R269 Unspecified abnormalities of gait and mobility: Secondary | ICD-10-CM | POA: Diagnosis not present

## 2023-05-30 DIAGNOSIS — M542 Cervicalgia: Secondary | ICD-10-CM | POA: Diagnosis not present

## 2023-05-30 DIAGNOSIS — R269 Unspecified abnormalities of gait and mobility: Secondary | ICD-10-CM | POA: Diagnosis not present

## 2023-05-30 DIAGNOSIS — M5459 Other low back pain: Secondary | ICD-10-CM | POA: Diagnosis not present

## 2023-05-30 DIAGNOSIS — M6281 Muscle weakness (generalized): Secondary | ICD-10-CM | POA: Diagnosis not present

## 2023-06-02 DIAGNOSIS — F902 Attention-deficit hyperactivity disorder, combined type: Secondary | ICD-10-CM | POA: Diagnosis not present

## 2023-06-03 DIAGNOSIS — M542 Cervicalgia: Secondary | ICD-10-CM | POA: Diagnosis not present

## 2023-06-03 DIAGNOSIS — M5459 Other low back pain: Secondary | ICD-10-CM | POA: Diagnosis not present

## 2023-06-03 DIAGNOSIS — M6281 Muscle weakness (generalized): Secondary | ICD-10-CM | POA: Diagnosis not present

## 2023-06-03 DIAGNOSIS — R269 Unspecified abnormalities of gait and mobility: Secondary | ICD-10-CM | POA: Diagnosis not present

## 2023-06-15 DIAGNOSIS — E569 Vitamin deficiency, unspecified: Secondary | ICD-10-CM | POA: Diagnosis not present

## 2023-06-16 DIAGNOSIS — R269 Unspecified abnormalities of gait and mobility: Secondary | ICD-10-CM | POA: Diagnosis not present

## 2023-06-16 DIAGNOSIS — M5459 Other low back pain: Secondary | ICD-10-CM | POA: Diagnosis not present

## 2023-06-16 DIAGNOSIS — M542 Cervicalgia: Secondary | ICD-10-CM | POA: Diagnosis not present

## 2023-06-16 DIAGNOSIS — M6281 Muscle weakness (generalized): Secondary | ICD-10-CM | POA: Diagnosis not present

## 2023-06-23 DIAGNOSIS — M542 Cervicalgia: Secondary | ICD-10-CM | POA: Diagnosis not present

## 2023-06-23 DIAGNOSIS — M6281 Muscle weakness (generalized): Secondary | ICD-10-CM | POA: Diagnosis not present

## 2023-06-23 DIAGNOSIS — R269 Unspecified abnormalities of gait and mobility: Secondary | ICD-10-CM | POA: Diagnosis not present

## 2023-06-23 DIAGNOSIS — M5459 Other low back pain: Secondary | ICD-10-CM | POA: Diagnosis not present

## 2023-07-04 IMAGING — CT CT HEAD W/O CM
5 of 8 series · 17 of 47 positions shown, 18 images · non-contrast
Comparison: None.

CLINICAL DATA: Fall down stairs

EXAM:
CT HEAD WITHOUT CONTRAST
CT CERVICAL SPINE WITHOUT CONTRAST
TECHNIQUE: Multidetector CT imaging of the head and cervical spine was
performed following the standard protocol without intravenous
contrast. Multiplanar CT image reconstructions of the cervical spine
were also generated.

[Series 3: head wo · axial · 0.41mm/px · z∈[-139,-84]mm · 2 of 33 slices shown, 3 images (1 of 2)]
[im 11/33  brain]
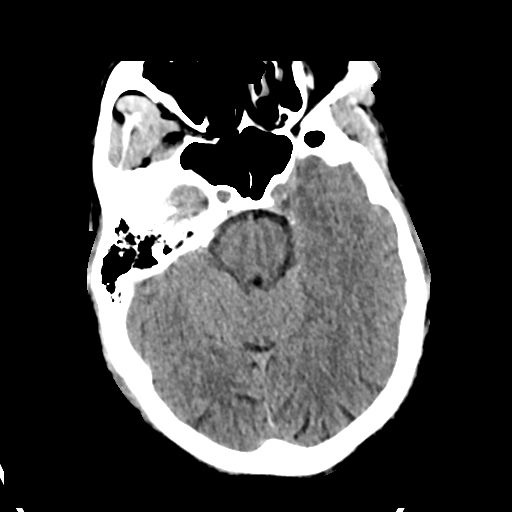
[im 11/33  bone]
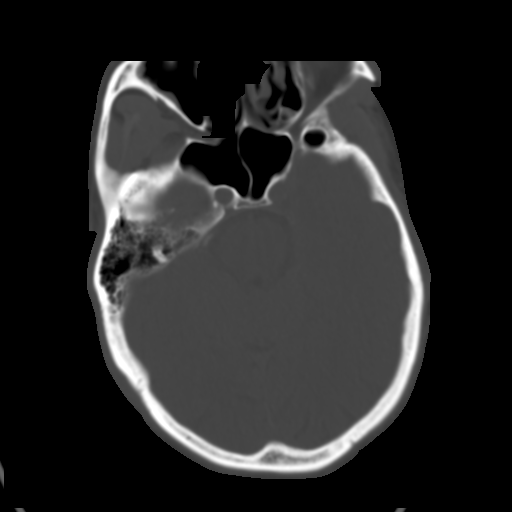
[im 22/33  brain]
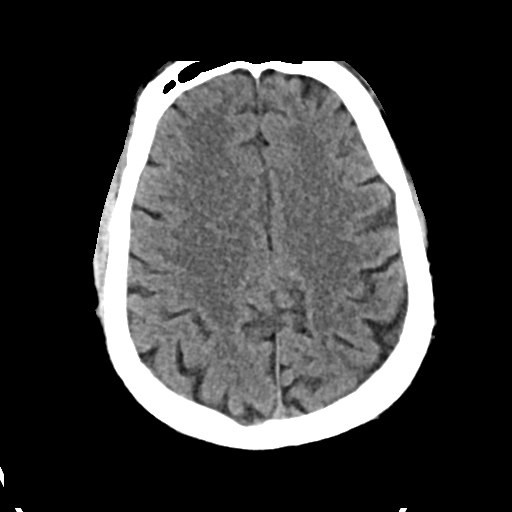

[Series 4: head wo · axial · 0.41mm/px · z∈[-139,-84]mm · 2 of 33 slices shown (2 of 2)]
[im 11/33  brain]
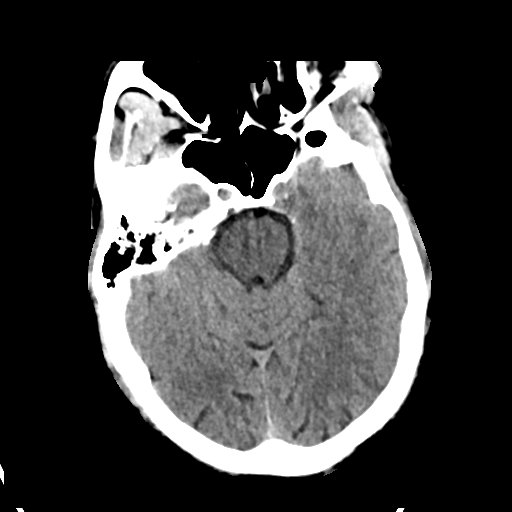
[im 22/33  brain]
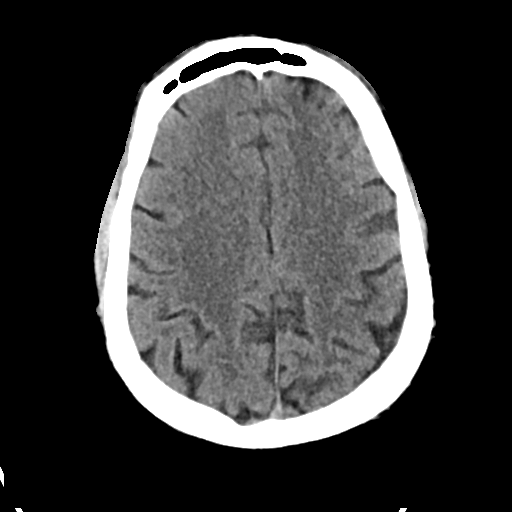

[Series 5: head bone · axial · 0.41mm/px · z∈[-173,-43]mm · 8 of 83 slices shown]
[im 9/83  bone]
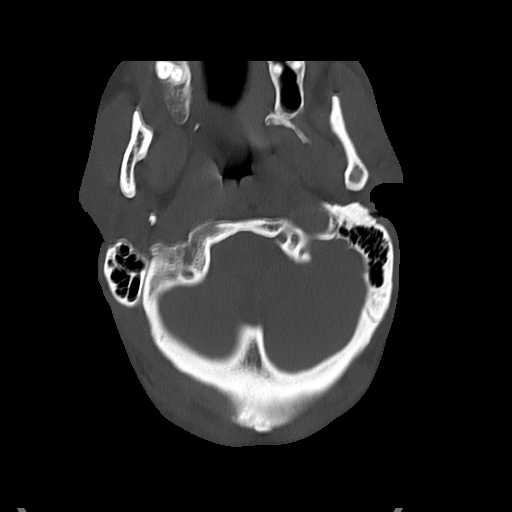
[im 17/83  bone]
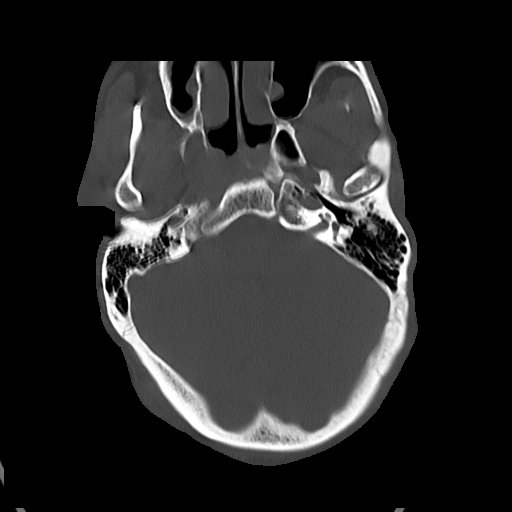
[im 25/83  bone]
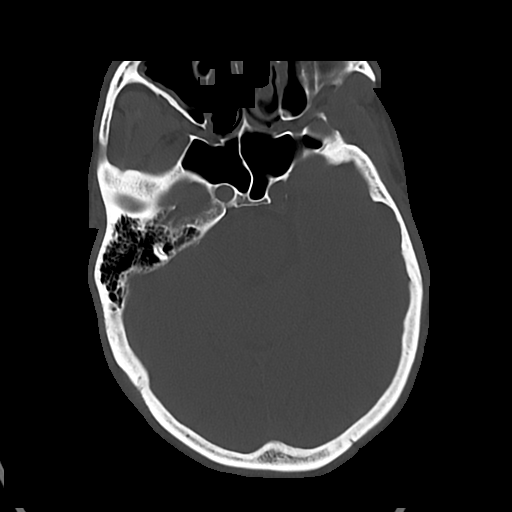
[im 33/83  bone]
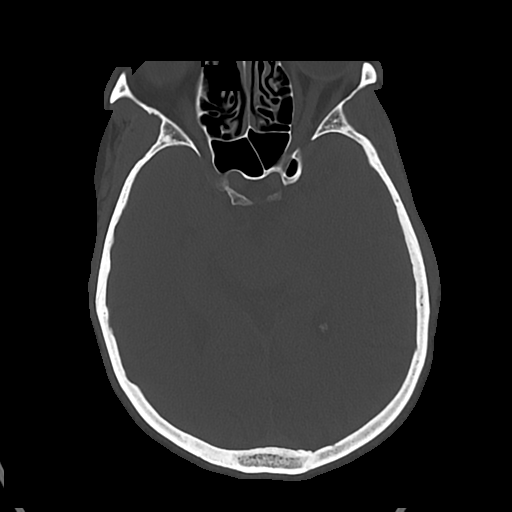
[im 50/83  bone]
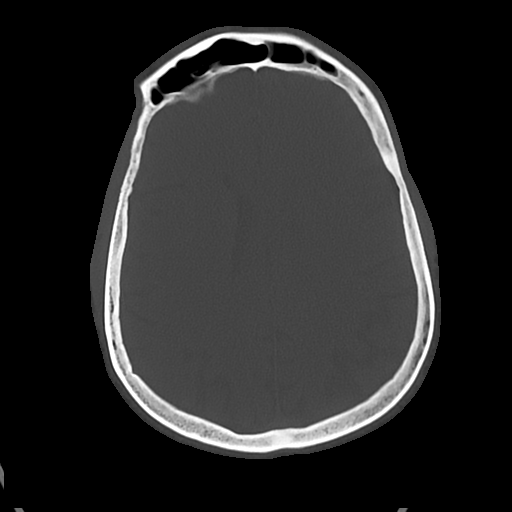
[im 58/83  bone]
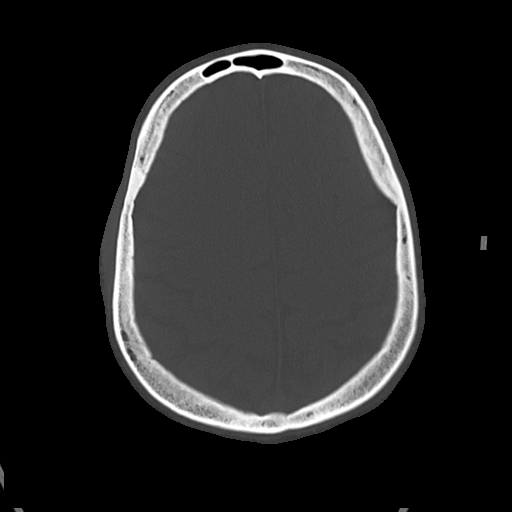
[im 66/83  bone]
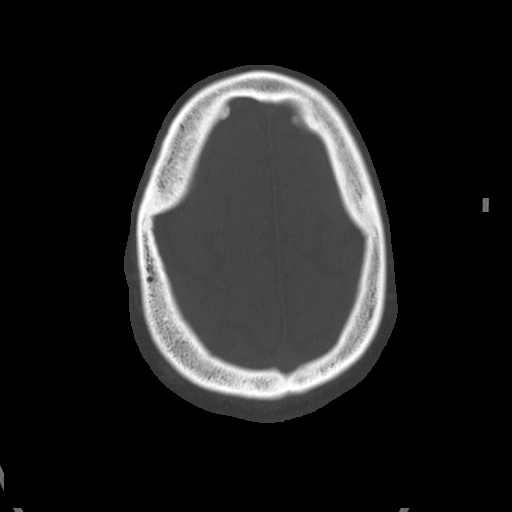
[im 74/83  bone]
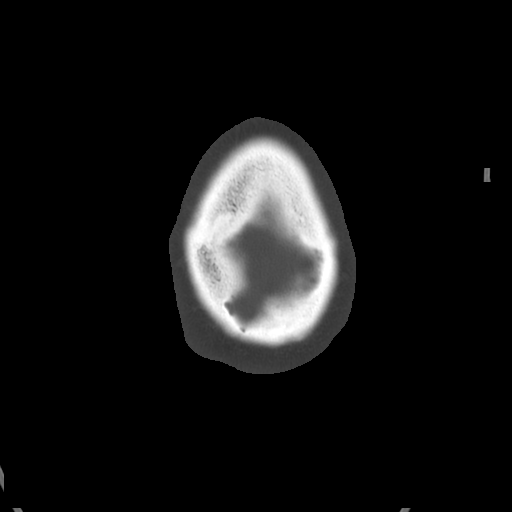

[Series 6: cor soft · coronal · 0.33mm/px · 3 of 74 slices shown]
[im 19/74  brain]
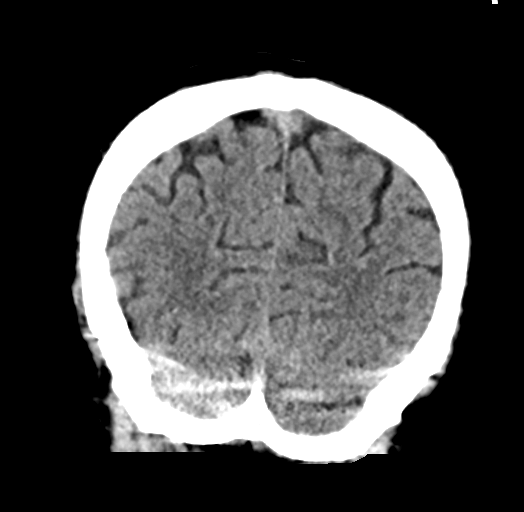
[im 37/74  brain]
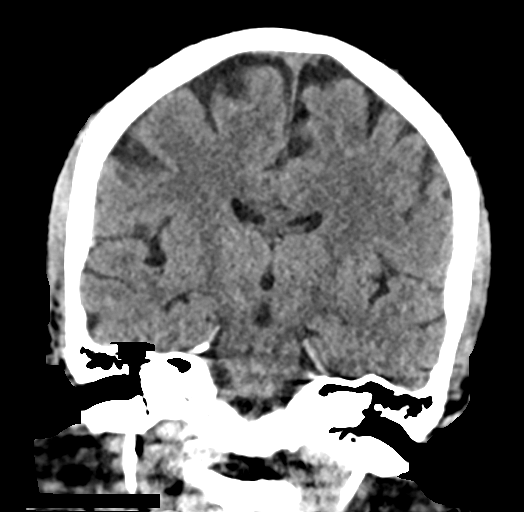
[im 55/74  brain]
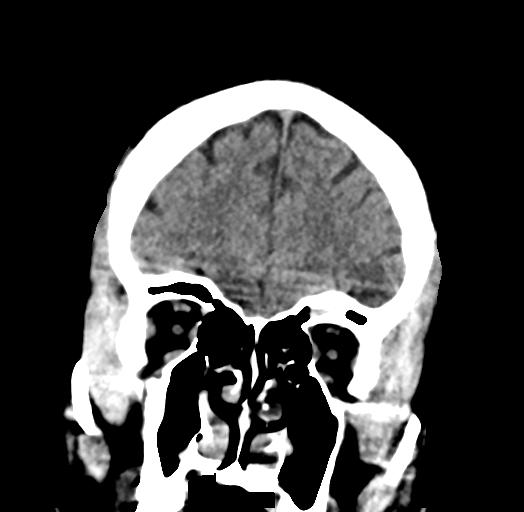

[Series 10: sag soft · sagittal · 0.34mm/px · 2 of 58 slices shown]
[im 20/58  brain]
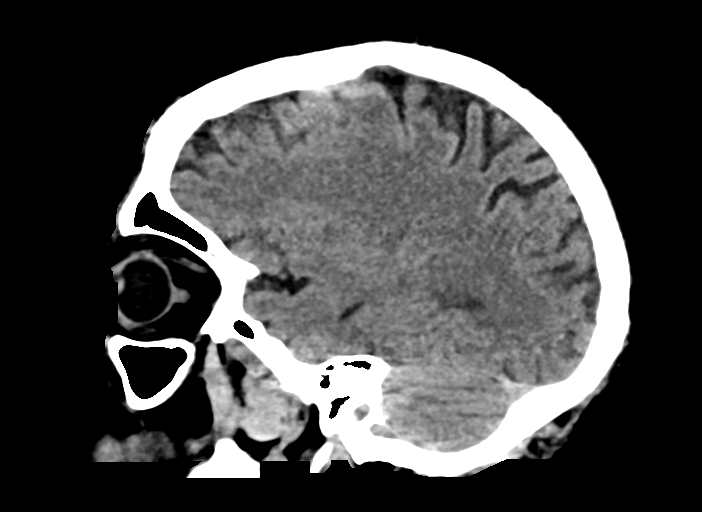
[im 39/58  brain]
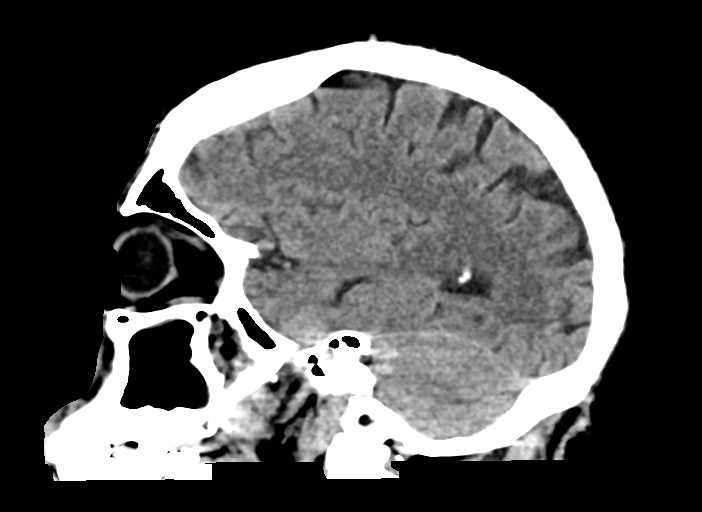

[17 of 47 positions shown; findings below may reference images not displayed]

FINDINGS: CT HEAD FINDINGS

Brain: No evidence of acute infarction, hemorrhage, hydrocephalus,
extra-axial collection or mass lesion/mass effect.

Vascular: No hyperdense vessel or unexpected calcification.

Skull: Normal. Negative for fracture or focal lesion.

Sinuses/Orbits: No acute finding.

CT CERVICAL SPINE FINDINGS

Alignment: Normal.

Skull base and vertebrae: No acute fracture. No primary bone lesion
or focal pathologic process.

Soft tissues and spinal canal: No prevertebral fluid or swelling. No
visible canal hematoma.

Disc levels:  No visible impingement

Upper chest: Negative
IMPRESSION: No evidence of intracranial or cervical spine injury.

## 2023-07-04 IMAGING — CT CT CHEST W/ CM
2 of 4 series · 15 of 46 positions shown, 17 images · IV contrast (omnipaque)
Comparison: None.

CLINICAL DATA: Fall with right rib pain

EXAM:
CT CHEST, ABDOMEN, AND PELVIS WITH CONTRAST
TECHNIQUE: Multidetector CT imaging of the chest, abdomen and pelvis was
performed following the standard protocol during bolus
administration of intravenous contrast.
CONTRAST:  75mL OMNIPAQUE IOHEXOL 350 MG/ML SOLN

[Series 3: cap with · axial · 0.66mm/px · z∈[-886,-341]mm · 12 of 129 slices shown, 14 images]
[im 10/129  soft-tissue]
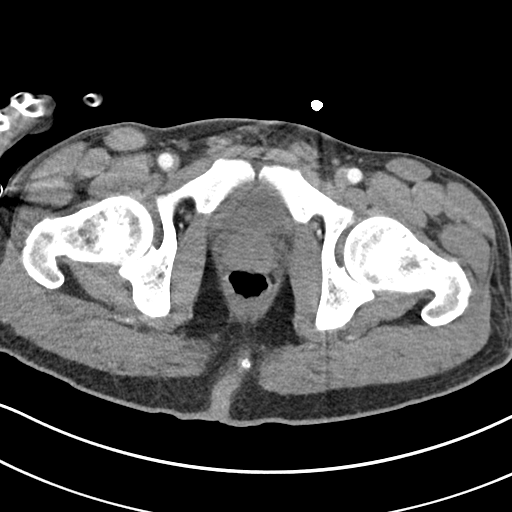
[im 10/129  bone]
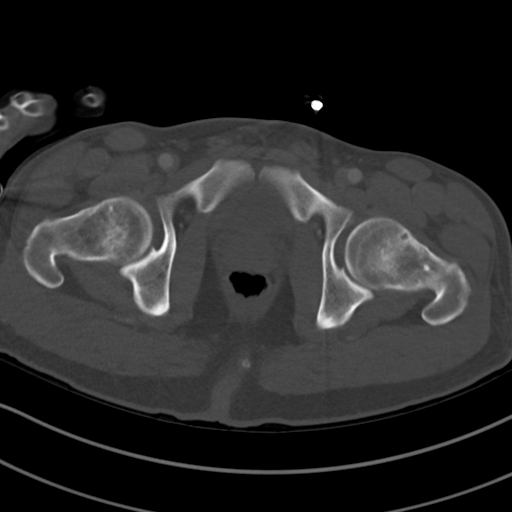
[im 19/129  soft-tissue]
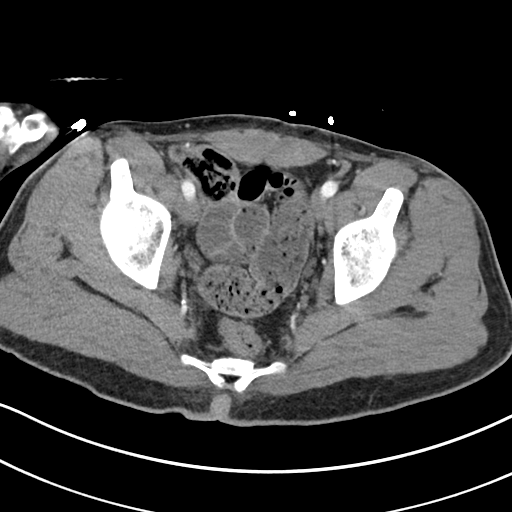
[im 28/129  soft-tissue]
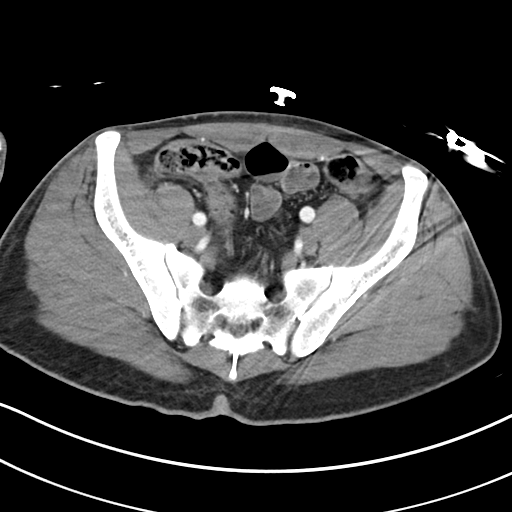
[im 37/129  soft-tissue]
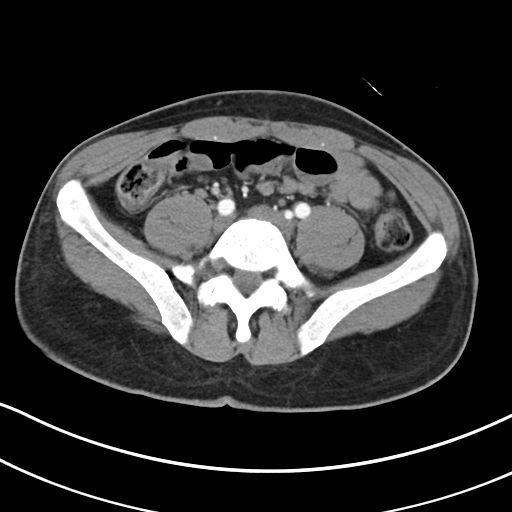
[im 46/129  soft-tissue]
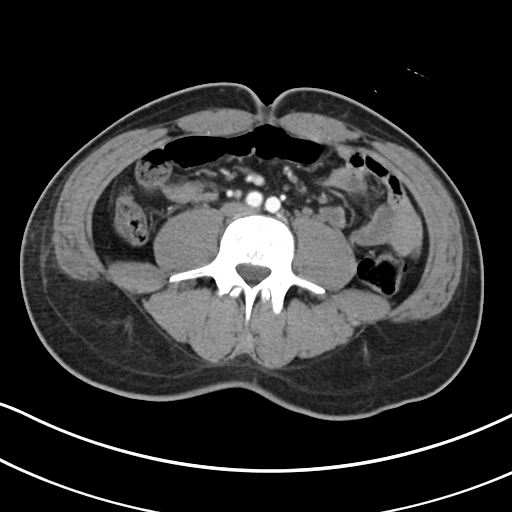
[im 55/129  soft-tissue]
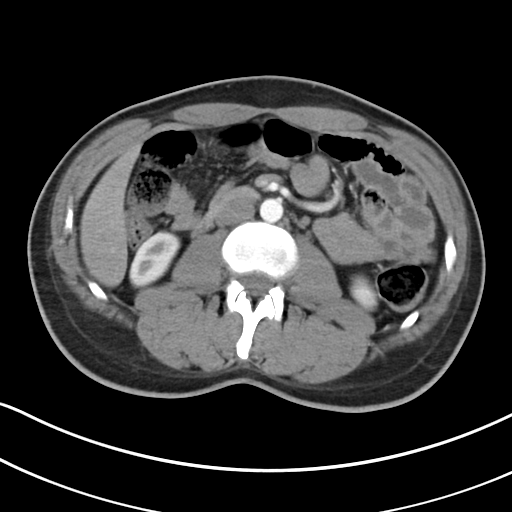
[im 74/129  soft-tissue]
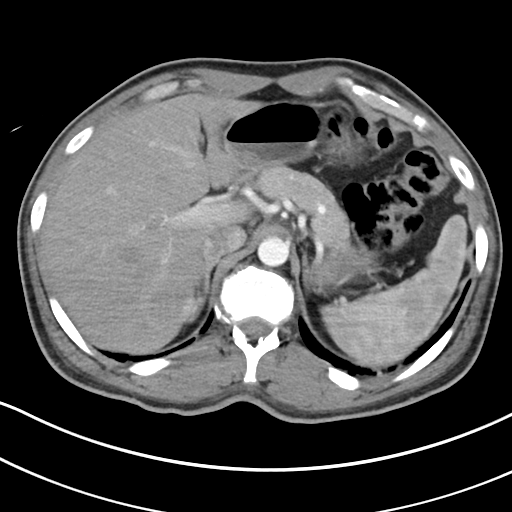
[im 83/129  soft-tissue]
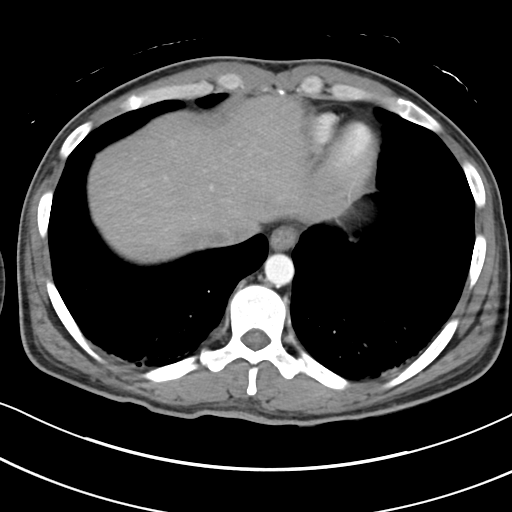
[im 92/129  soft-tissue]
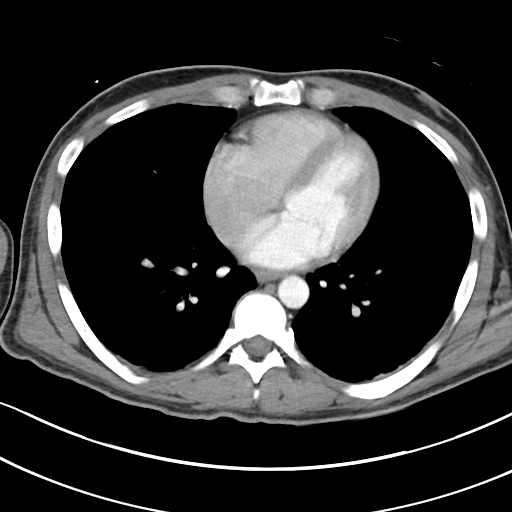
[im 92/129  bone]
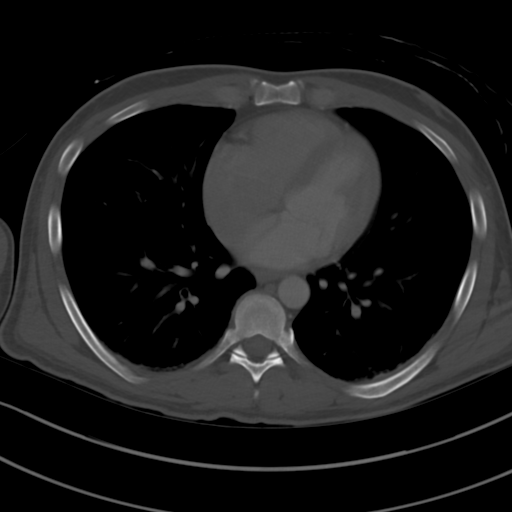
[im 101/129  soft-tissue]
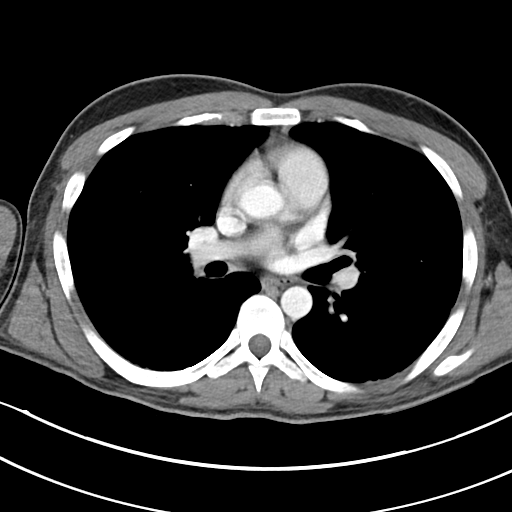
[im 110/129  soft-tissue]
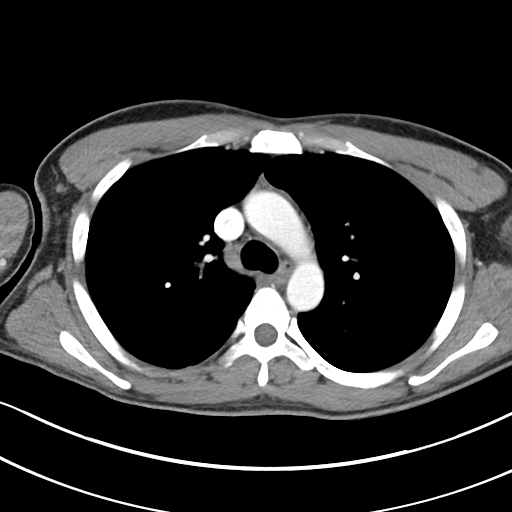
[im 119/129  soft-tissue]
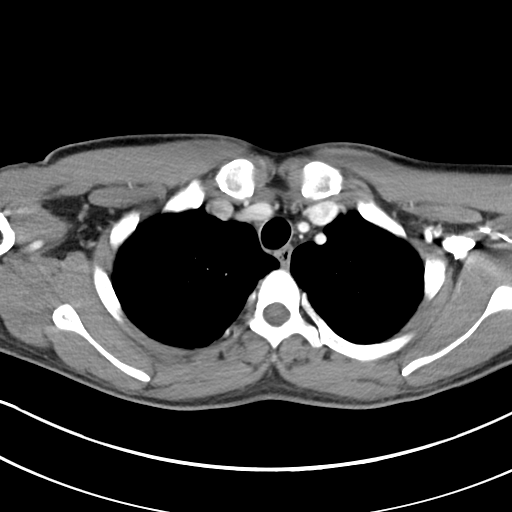

[Series 6: cor · coronal · 0.71mm/px · 3 of 83 slices shown]
[im 28/83  soft-tissue]
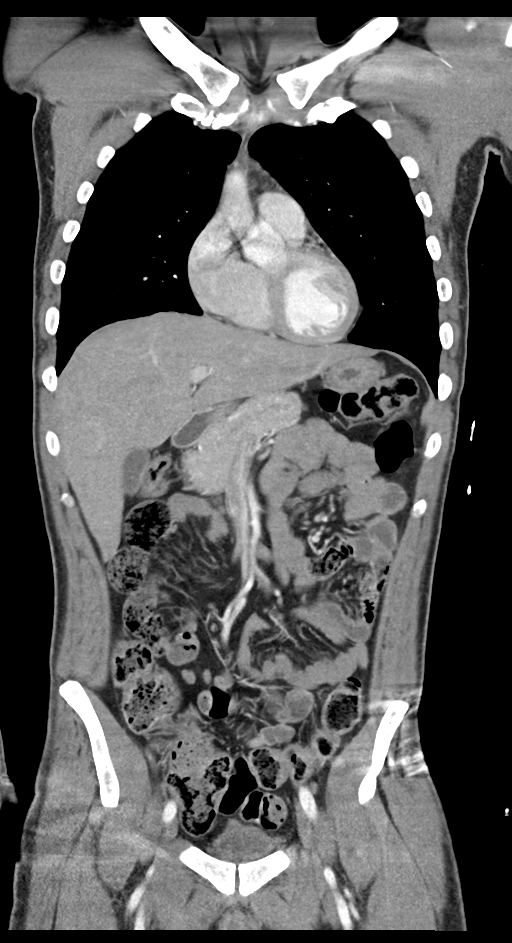
[im 37/83  soft-tissue]
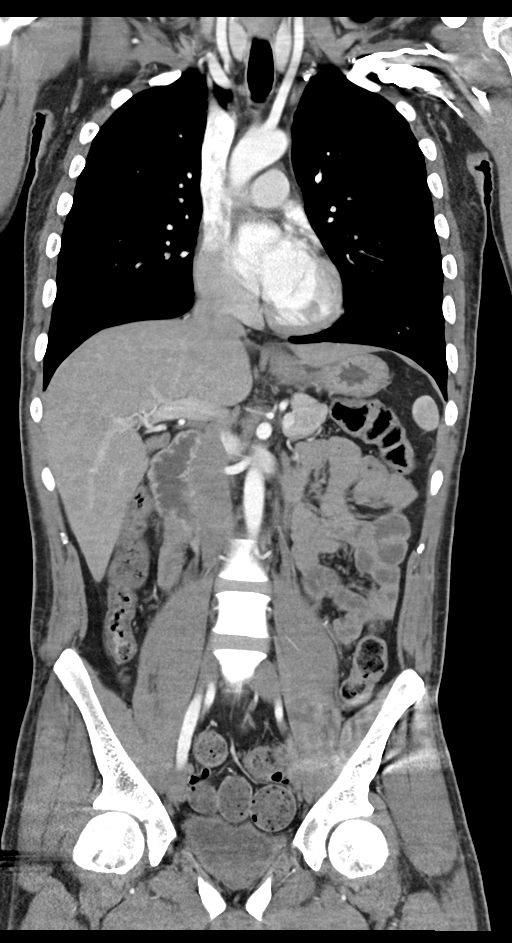
[im 46/83  soft-tissue]
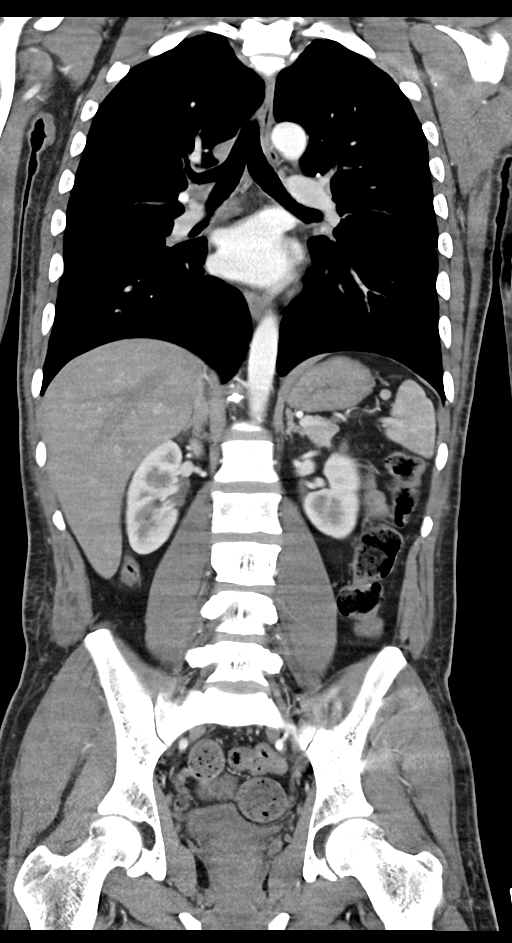

[15 of 46 positions shown; findings below may reference images not displayed]

FINDINGS: CT CHEST FINDINGS

Cardiovascular: No significant vascular findings. Normal heart size.
No pericardial effusion.

Mediastinum/Nodes: No hematoma or air leak

Lungs/Pleura: Lungs are clear. No pleural effusion or pneumothorax.

Musculoskeletal: Negative for fracture

CT ABDOMEN PELVIS FINDINGS

Hepatobiliary: No hepatic injury or perihepatic hematoma.
Gallbladder is unremarkable.

Pancreas: Negative

Spleen: No splenic injury or perisplenic hematoma.

Adrenals/Urinary Tract: No adrenal hemorrhage or renal injury
identified. Bladder is unremarkable.

Stomach/Bowel: No evidence of injury

Vascular/Lymphatic: Negative

Reproductive: Negative

Other: No ascites or pneumoperitoneum

Musculoskeletal: No fracture or subluxation
IMPRESSION: No evidence of injury to the chest or abdomen

## 2023-07-04 IMAGING — CT CT CERVICAL SPINE W/O CM
4 series · 15 of 33 positions shown, 18 images · non-contrast
Comparison: None.

CLINICAL DATA: Fall down stairs

EXAM:
CT HEAD WITHOUT CONTRAST
CT CERVICAL SPINE WITHOUT CONTRAST
TECHNIQUE: Multidetector CT imaging of the head and cervical spine was
performed following the standard protocol without intravenous
contrast. Multiplanar CT image reconstructions of the cervical spine
were also generated.

[Series 5: orthogonal axials · axial · 0.21mm/px · z∈[-316,-197]mm · 5 of 89 slices shown, 7 images]
[im 15/89  soft-tissue]
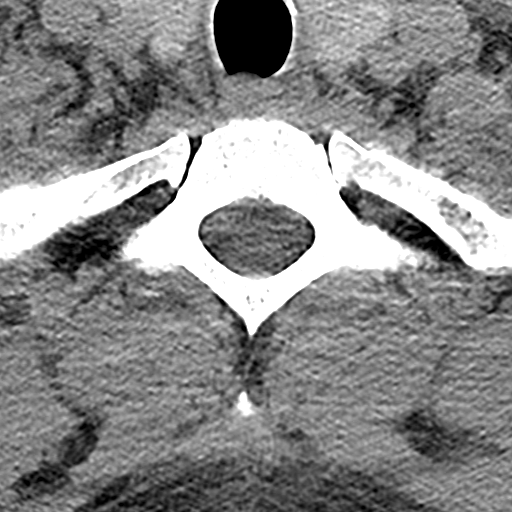
[im 15/89  bone]
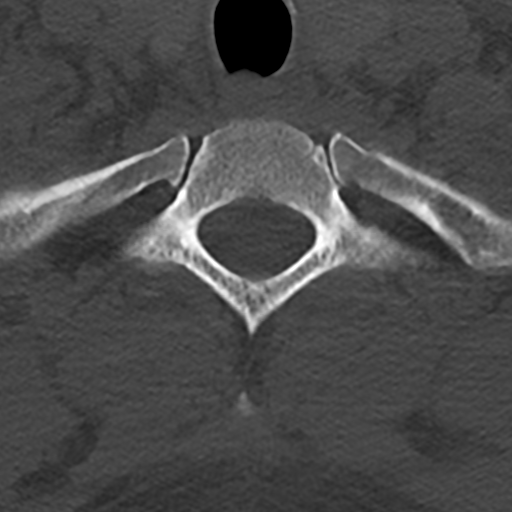
[im 30/89  bone]
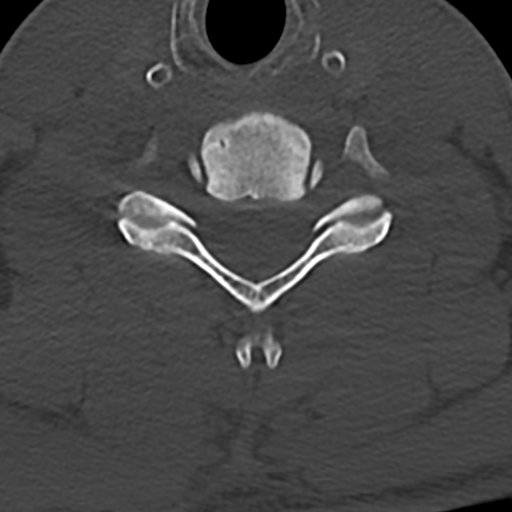
[im 45/89  bone]
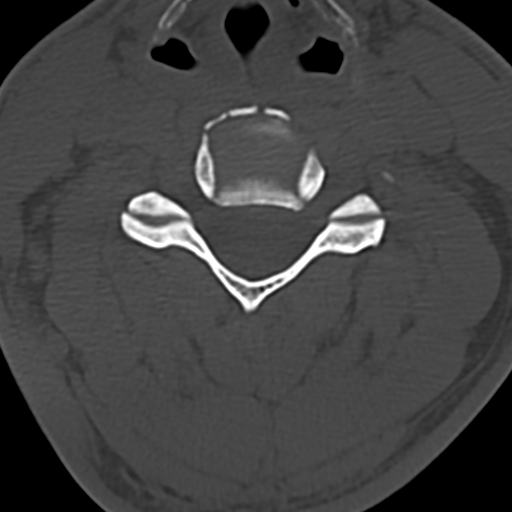
[im 59/89  bone]
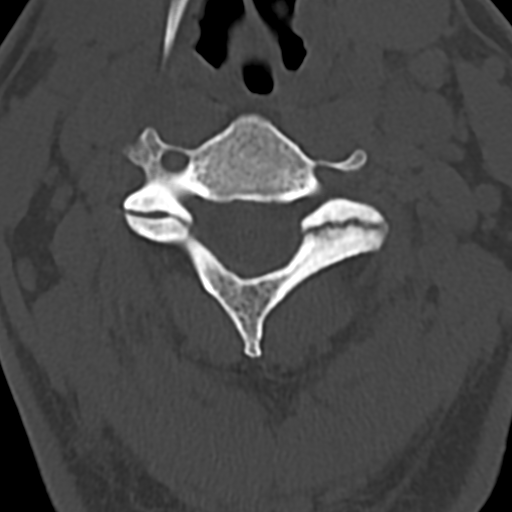
[im 74/89  soft-tissue]
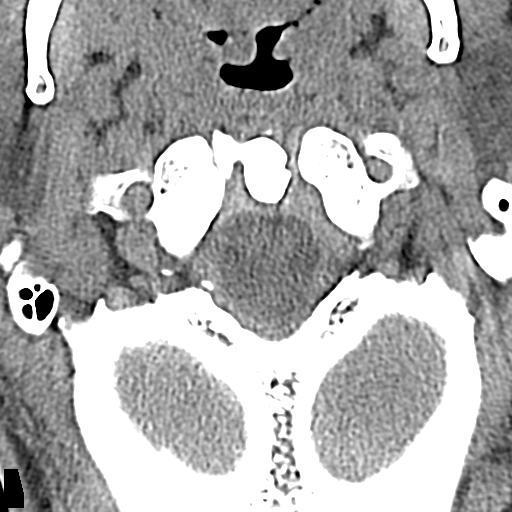
[im 74/89  bone]
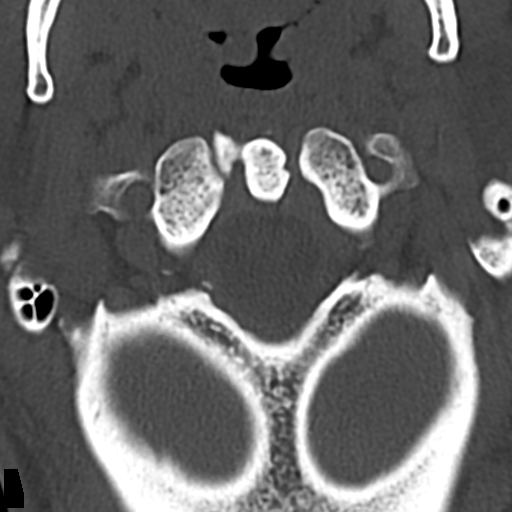

[Series 6: c spine soft · axial · 0.30mm/px · z∈[-302,-272]mm · 2 of 89 slices shown]
[im 15/89  soft-tissue]
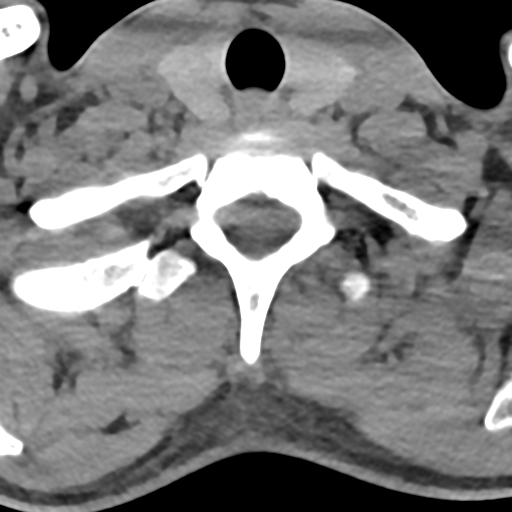
[im 30/89  soft-tissue]
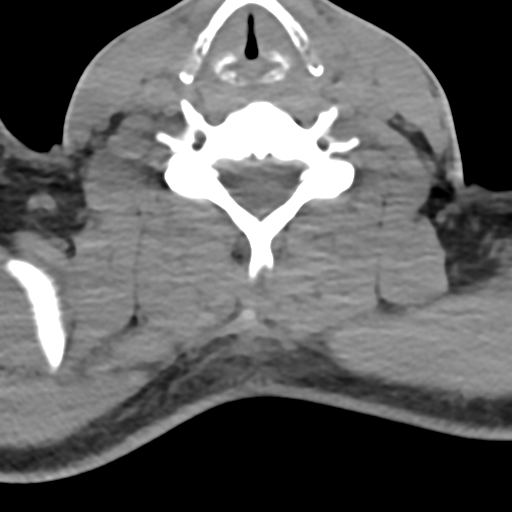

[Series 9: sag bone · sagittal · 0.34mm/px · 5 of 94 slices shown, 6 images]
[im 32/94  bone]
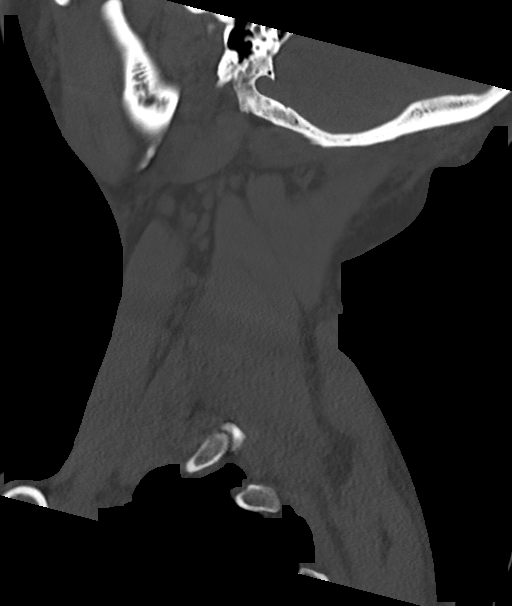
[im 39/94  bone]
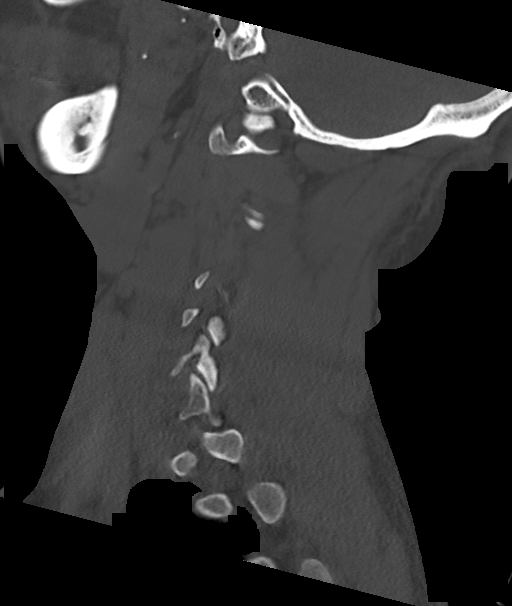
[im 47/94  soft-tissue]
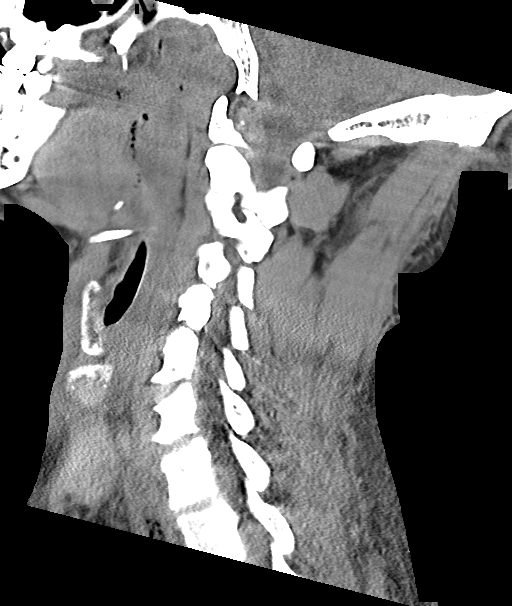
[im 47/94  bone]
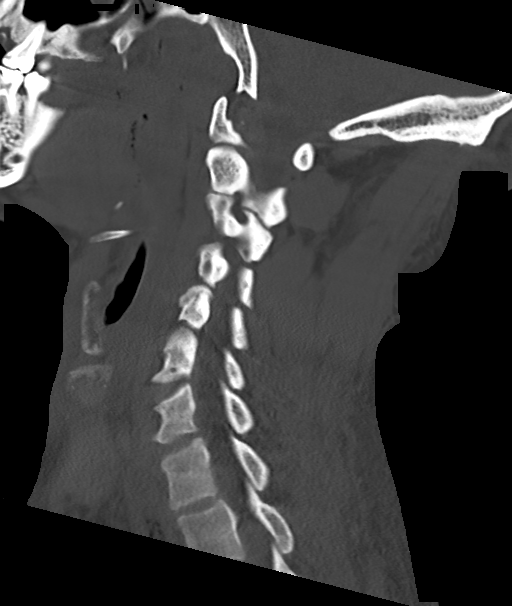
[im 55/94  bone]
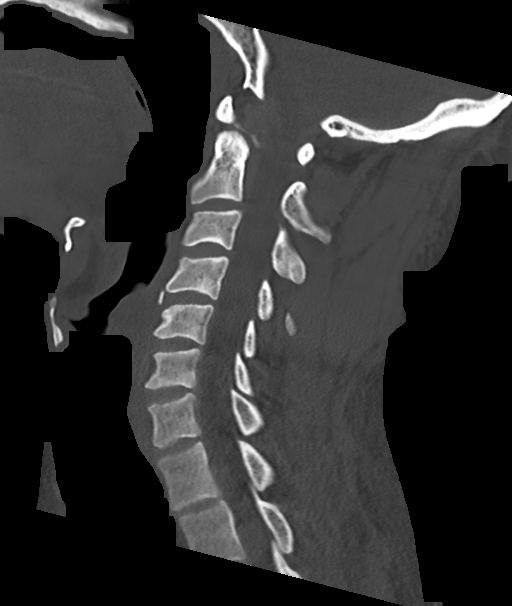
[im 63/94  bone]
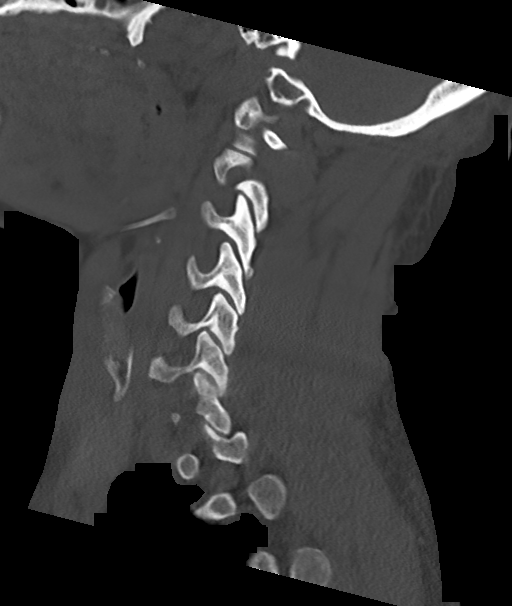

[Series 10: cor bone · coronal · 0.36mm/px · 3 of 115 slices shown]
[im 23/115  bone]
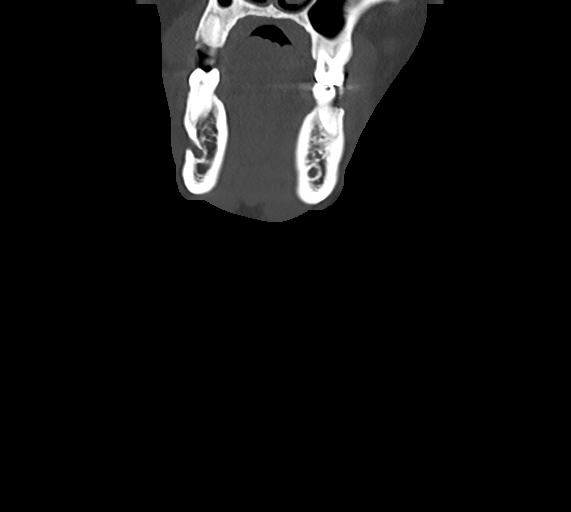
[im 46/115  bone]
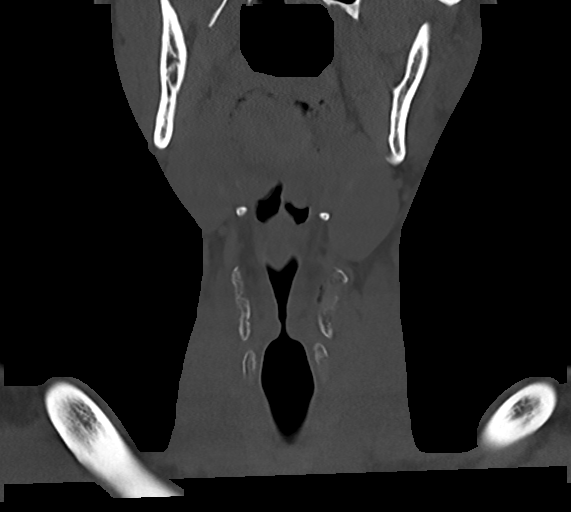
[im 69/115  bone]
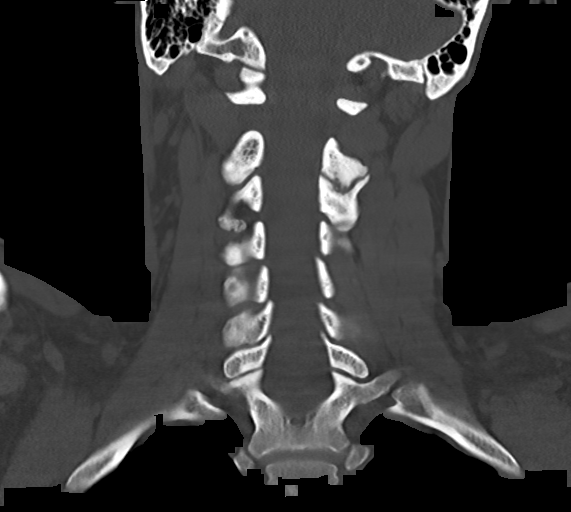

[15 of 33 positions shown; findings below may reference images not displayed]

FINDINGS: CT HEAD FINDINGS

Brain: No evidence of acute infarction, hemorrhage, hydrocephalus,
extra-axial collection or mass lesion/mass effect.

Vascular: No hyperdense vessel or unexpected calcification.

Skull: Normal. Negative for fracture or focal lesion.

Sinuses/Orbits: No acute finding.

CT CERVICAL SPINE FINDINGS

Alignment: Normal.

Skull base and vertebrae: No acute fracture. No primary bone lesion
or focal pathologic process.

Soft tissues and spinal canal: No prevertebral fluid or swelling. No
visible canal hematoma.

Disc levels:  No visible impingement

Upper chest: Negative
IMPRESSION: No evidence of intracranial or cervical spine injury.

## 2023-07-06 DIAGNOSIS — M542 Cervicalgia: Secondary | ICD-10-CM | POA: Diagnosis not present

## 2023-07-06 DIAGNOSIS — M6281 Muscle weakness (generalized): Secondary | ICD-10-CM | POA: Diagnosis not present

## 2023-07-06 DIAGNOSIS — M5459 Other low back pain: Secondary | ICD-10-CM | POA: Diagnosis not present

## 2023-07-06 DIAGNOSIS — R269 Unspecified abnormalities of gait and mobility: Secondary | ICD-10-CM | POA: Diagnosis not present

## 2023-07-07 DIAGNOSIS — M6281 Muscle weakness (generalized): Secondary | ICD-10-CM | POA: Diagnosis not present

## 2023-07-07 DIAGNOSIS — M542 Cervicalgia: Secondary | ICD-10-CM | POA: Diagnosis not present

## 2023-07-07 DIAGNOSIS — M5459 Other low back pain: Secondary | ICD-10-CM | POA: Diagnosis not present

## 2023-07-07 DIAGNOSIS — R269 Unspecified abnormalities of gait and mobility: Secondary | ICD-10-CM | POA: Diagnosis not present

## 2023-07-12 DIAGNOSIS — R269 Unspecified abnormalities of gait and mobility: Secondary | ICD-10-CM | POA: Diagnosis not present

## 2023-07-12 DIAGNOSIS — M5459 Other low back pain: Secondary | ICD-10-CM | POA: Diagnosis not present

## 2023-07-12 DIAGNOSIS — M6281 Muscle weakness (generalized): Secondary | ICD-10-CM | POA: Diagnosis not present

## 2023-07-12 DIAGNOSIS — M542 Cervicalgia: Secondary | ICD-10-CM | POA: Diagnosis not present

## 2023-07-14 DIAGNOSIS — M6281 Muscle weakness (generalized): Secondary | ICD-10-CM | POA: Diagnosis not present

## 2023-07-14 DIAGNOSIS — M5459 Other low back pain: Secondary | ICD-10-CM | POA: Diagnosis not present

## 2023-07-14 DIAGNOSIS — M542 Cervicalgia: Secondary | ICD-10-CM | POA: Diagnosis not present

## 2023-07-14 DIAGNOSIS — R269 Unspecified abnormalities of gait and mobility: Secondary | ICD-10-CM | POA: Diagnosis not present

## 2023-07-19 DIAGNOSIS — R269 Unspecified abnormalities of gait and mobility: Secondary | ICD-10-CM | POA: Diagnosis not present

## 2023-07-19 DIAGNOSIS — M6281 Muscle weakness (generalized): Secondary | ICD-10-CM | POA: Diagnosis not present

## 2023-07-19 DIAGNOSIS — M542 Cervicalgia: Secondary | ICD-10-CM | POA: Diagnosis not present

## 2023-07-19 DIAGNOSIS — M5459 Other low back pain: Secondary | ICD-10-CM | POA: Diagnosis not present

## 2023-07-21 DIAGNOSIS — M542 Cervicalgia: Secondary | ICD-10-CM | POA: Diagnosis not present

## 2023-07-21 DIAGNOSIS — M5459 Other low back pain: Secondary | ICD-10-CM | POA: Diagnosis not present

## 2023-07-21 DIAGNOSIS — M6281 Muscle weakness (generalized): Secondary | ICD-10-CM | POA: Diagnosis not present

## 2023-07-21 DIAGNOSIS — R269 Unspecified abnormalities of gait and mobility: Secondary | ICD-10-CM | POA: Diagnosis not present

## 2023-07-27 DIAGNOSIS — M5459 Other low back pain: Secondary | ICD-10-CM | POA: Diagnosis not present

## 2023-07-27 DIAGNOSIS — M542 Cervicalgia: Secondary | ICD-10-CM | POA: Diagnosis not present

## 2023-07-27 DIAGNOSIS — M6281 Muscle weakness (generalized): Secondary | ICD-10-CM | POA: Diagnosis not present

## 2023-07-27 DIAGNOSIS — R269 Unspecified abnormalities of gait and mobility: Secondary | ICD-10-CM | POA: Diagnosis not present

## 2023-07-28 ENCOUNTER — Other Ambulatory Visit: Payer: Self-pay | Admitting: Cardiology

## 2023-08-01 ENCOUNTER — Ambulatory Visit: Admitting: Cardiology

## 2023-08-02 DIAGNOSIS — M5459 Other low back pain: Secondary | ICD-10-CM | POA: Diagnosis not present

## 2023-08-02 DIAGNOSIS — R269 Unspecified abnormalities of gait and mobility: Secondary | ICD-10-CM | POA: Diagnosis not present

## 2023-08-02 DIAGNOSIS — M6281 Muscle weakness (generalized): Secondary | ICD-10-CM | POA: Diagnosis not present

## 2023-08-02 DIAGNOSIS — M542 Cervicalgia: Secondary | ICD-10-CM | POA: Diagnosis not present

## 2023-08-08 DIAGNOSIS — M542 Cervicalgia: Secondary | ICD-10-CM | POA: Diagnosis not present

## 2023-08-08 DIAGNOSIS — M5459 Other low back pain: Secondary | ICD-10-CM | POA: Diagnosis not present

## 2023-08-08 DIAGNOSIS — R269 Unspecified abnormalities of gait and mobility: Secondary | ICD-10-CM | POA: Diagnosis not present

## 2023-08-08 DIAGNOSIS — M6281 Muscle weakness (generalized): Secondary | ICD-10-CM | POA: Diagnosis not present

## 2023-08-10 DIAGNOSIS — M542 Cervicalgia: Secondary | ICD-10-CM | POA: Diagnosis not present

## 2023-08-10 DIAGNOSIS — M5459 Other low back pain: Secondary | ICD-10-CM | POA: Diagnosis not present

## 2023-08-10 DIAGNOSIS — M6281 Muscle weakness (generalized): Secondary | ICD-10-CM | POA: Diagnosis not present

## 2023-08-10 DIAGNOSIS — R269 Unspecified abnormalities of gait and mobility: Secondary | ICD-10-CM | POA: Diagnosis not present

## 2023-08-17 DIAGNOSIS — M542 Cervicalgia: Secondary | ICD-10-CM | POA: Diagnosis not present

## 2023-08-17 DIAGNOSIS — M6281 Muscle weakness (generalized): Secondary | ICD-10-CM | POA: Diagnosis not present

## 2023-08-17 DIAGNOSIS — M5459 Other low back pain: Secondary | ICD-10-CM | POA: Diagnosis not present

## 2023-08-17 DIAGNOSIS — R269 Unspecified abnormalities of gait and mobility: Secondary | ICD-10-CM | POA: Diagnosis not present

## 2023-08-19 DIAGNOSIS — M542 Cervicalgia: Secondary | ICD-10-CM | POA: Diagnosis not present

## 2023-08-19 DIAGNOSIS — R269 Unspecified abnormalities of gait and mobility: Secondary | ICD-10-CM | POA: Diagnosis not present

## 2023-08-19 DIAGNOSIS — M5459 Other low back pain: Secondary | ICD-10-CM | POA: Diagnosis not present

## 2023-08-19 DIAGNOSIS — M6281 Muscle weakness (generalized): Secondary | ICD-10-CM | POA: Diagnosis not present

## 2023-08-24 DIAGNOSIS — M542 Cervicalgia: Secondary | ICD-10-CM | POA: Diagnosis not present

## 2023-08-24 DIAGNOSIS — R269 Unspecified abnormalities of gait and mobility: Secondary | ICD-10-CM | POA: Diagnosis not present

## 2023-08-24 DIAGNOSIS — M5459 Other low back pain: Secondary | ICD-10-CM | POA: Diagnosis not present

## 2023-08-24 DIAGNOSIS — M6281 Muscle weakness (generalized): Secondary | ICD-10-CM | POA: Diagnosis not present

## 2023-08-31 DIAGNOSIS — R269 Unspecified abnormalities of gait and mobility: Secondary | ICD-10-CM | POA: Diagnosis not present

## 2023-08-31 DIAGNOSIS — M6281 Muscle weakness (generalized): Secondary | ICD-10-CM | POA: Diagnosis not present

## 2023-08-31 DIAGNOSIS — M542 Cervicalgia: Secondary | ICD-10-CM | POA: Diagnosis not present

## 2023-08-31 DIAGNOSIS — M5459 Other low back pain: Secondary | ICD-10-CM | POA: Diagnosis not present

## 2023-09-07 DIAGNOSIS — M542 Cervicalgia: Secondary | ICD-10-CM | POA: Diagnosis not present

## 2023-09-07 DIAGNOSIS — M6281 Muscle weakness (generalized): Secondary | ICD-10-CM | POA: Diagnosis not present

## 2023-09-07 DIAGNOSIS — M5459 Other low back pain: Secondary | ICD-10-CM | POA: Diagnosis not present

## 2023-09-07 DIAGNOSIS — R269 Unspecified abnormalities of gait and mobility: Secondary | ICD-10-CM | POA: Diagnosis not present

## 2023-09-08 ENCOUNTER — Telehealth: Payer: Self-pay

## 2023-09-08 NOTE — Telephone Encounter (Signed)
 Please call pt and let him know his FL2 is ready for pick up, copy has been made

## 2023-09-21 DIAGNOSIS — M5459 Other low back pain: Secondary | ICD-10-CM | POA: Diagnosis not present

## 2023-09-21 DIAGNOSIS — M6281 Muscle weakness (generalized): Secondary | ICD-10-CM | POA: Diagnosis not present

## 2023-09-21 DIAGNOSIS — M542 Cervicalgia: Secondary | ICD-10-CM | POA: Diagnosis not present

## 2023-09-21 DIAGNOSIS — R269 Unspecified abnormalities of gait and mobility: Secondary | ICD-10-CM | POA: Diagnosis not present

## 2023-09-23 DIAGNOSIS — R269 Unspecified abnormalities of gait and mobility: Secondary | ICD-10-CM | POA: Diagnosis not present

## 2023-09-23 DIAGNOSIS — M542 Cervicalgia: Secondary | ICD-10-CM | POA: Diagnosis not present

## 2023-09-23 DIAGNOSIS — M6281 Muscle weakness (generalized): Secondary | ICD-10-CM | POA: Diagnosis not present

## 2023-09-23 DIAGNOSIS — M5459 Other low back pain: Secondary | ICD-10-CM | POA: Diagnosis not present

## 2023-11-02 ENCOUNTER — Telehealth: Payer: Self-pay

## 2023-11-02 NOTE — Telephone Encounter (Signed)
 Pt needs a clearance form signed by his PCP and sent to [facility]. [facility] has faxed over information regarding this. Fax #: [phone].

## 2023-11-07 NOTE — Telephone Encounter (Signed)
 She has been informed that we need the fax before we can send it

## 2023-11-07 NOTE — Telephone Encounter (Signed)
 We have not received the clearance form, male associated with patient has called 4 times today asking for this to be sent over, [facility] has called [facility] to get them to refax the form but we have still not gotten it yet, will call the male back to let her know

## 2023-11-28 ENCOUNTER — Telehealth: Payer: Self-pay

## 2023-11-28 NOTE — Telephone Encounter (Signed)
 Inocente with [facility] called about pt and getting some kind of services need to call her back and find out what they're requesting

## 2023-11-29 ENCOUNTER — Other Ambulatory Visit: Payer: Self-pay | Admitting: Cardiology

## 2023-11-29 DIAGNOSIS — G10 Huntington's disease: Secondary | ICD-10-CM

## 2023-11-29 DIAGNOSIS — F4310 Post-traumatic stress disorder, unspecified: Secondary | ICD-10-CM

## 2023-11-29 DIAGNOSIS — R52 Pain, unspecified: Secondary | ICD-10-CM

## 2023-12-01 NOTE — Telephone Encounter (Signed)
 Patient will need and office visit per the [facility] agency since he hasn't been seen recently

## 2023-12-05 ENCOUNTER — Ambulatory Visit: Payer: MEDICAID | Admitting: Cardiology

## 2023-12-28 ENCOUNTER — Encounter: Payer: Self-pay | Admitting: Cardiology

## 2023-12-28 ENCOUNTER — Ambulatory Visit: Payer: MEDICAID | Admitting: Cardiology

## 2023-12-28 VITALS — BP 110/70 | Ht 76.0 in | Wt 164.2 lb

## 2023-12-28 DIAGNOSIS — G10 Huntington's disease: Secondary | ICD-10-CM

## 2023-12-28 DIAGNOSIS — G255 Other chorea: Secondary | ICD-10-CM

## 2023-12-28 DIAGNOSIS — R52 Pain, unspecified: Secondary | ICD-10-CM | POA: Diagnosis not present
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
## 2024-01-09 ENCOUNTER — Telehealth: Payer: Self-pay

## 2024-01-09 NOTE — Telephone Encounter (Signed)
 [facility] called in regards to a referral sent to them for this pt. They called to inform that they have to decline the referral due to being understaffed. Please advise.

## 2024-01-12 NOTE — Telephone Encounter (Signed)
 Left message to return call

## 2024-01-30 ENCOUNTER — Ambulatory Visit: Payer: MEDICAID | Admitting: Cardiology

## 2024-02-13 ENCOUNTER — Ambulatory Visit: Payer: MEDICAID | Admitting: Cardiology

## 2024-02-16 ENCOUNTER — Ambulatory Visit: Payer: MEDICAID | Admitting: Cardiology

## 2024-02-17 ENCOUNTER — Ambulatory Visit: Payer: MEDICAID | Admitting: Cardiology

## 2024-02-17 ENCOUNTER — Encounter: Payer: Self-pay | Admitting: Cardiology

## 2024-02-17 VITALS — BP 126/82 | HR 79 | Ht 76.0 in | Wt 162.2 lb

## 2024-02-17 DIAGNOSIS — M79604 Pain in right leg: Secondary | ICD-10-CM | POA: Diagnosis not present

## 2024-02-17 DIAGNOSIS — F22 Delusional disorders: Secondary | ICD-10-CM

## 2024-02-17 DIAGNOSIS — G10 Huntington's disease: Secondary | ICD-10-CM | POA: Diagnosis not present

## 2024-02-17 DIAGNOSIS — Z0130 Encounter for examination of blood pressure without abnormal findings: Secondary | ICD-10-CM

## 2024-02-17 DIAGNOSIS — G8929 Other chronic pain: Secondary | ICD-10-CM

## 2024-02-17 DIAGNOSIS — R52 Pain, unspecified: Secondary | ICD-10-CM | POA: Diagnosis not present
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
## 2024-02-22 ENCOUNTER — Ambulatory Visit (HOSPITAL_COMMUNITY): Admission: EM | Admit: 2024-02-22 | Payer: MEDICAID

## 2024-02-22 ENCOUNTER — Encounter (HOSPITAL_COMMUNITY): Payer: Self-pay

## 2024-02-22 ENCOUNTER — Telehealth: Payer: Self-pay

## 2024-02-22 ENCOUNTER — Other Ambulatory Visit: Payer: Self-pay

## 2024-02-22 DIAGNOSIS — G10 Huntington's disease: Secondary | ICD-10-CM | POA: Diagnosis not present

## 2024-02-22 DIAGNOSIS — F4310 Post-traumatic stress disorder, unspecified: Secondary | ICD-10-CM

## 2024-02-22 DIAGNOSIS — F22 Delusional disorders: Secondary | ICD-10-CM | POA: Diagnosis not present

## 2024-02-22 LAB — CBC WITH DIFFERENTIAL/PLATELET
Abs Immature Granulocytes: 0.04 10*3/uL (ref 0.00–0.07)
Basophils Absolute: 0 10*3/uL (ref 0.0–0.1)
Basophils Relative: 0 %
Eosinophils Absolute: 0 10*3/uL (ref 0.0–0.5)
Eosinophils Relative: 0 %
HCT: 45.3 % (ref 39.0–52.0)
Hemoglobin: 15.2 g/dL (ref 13.0–17.0)
Immature Granulocytes: 0 %
Lymphocytes Relative: 10 %
Lymphs Abs: 1.1 10*3/uL (ref 0.7–4.0)
MCH: 31.7 pg (ref 26.0–34.0)
MCHC: 33.6 g/dL (ref 30.0–36.0)
MCV: 94.6 fL (ref 80.0–100.0)
Monocytes Absolute: 0.4 10*3/uL (ref 0.1–1.0)
Monocytes Relative: 4 %
Neutro Abs: 10.2 10*3/uL — ABNORMAL HIGH (ref 1.7–7.7)
Neutrophils Relative %: 86 %
Platelets: 207 10*3/uL (ref 150–400)
RBC: 4.79 MIL/uL (ref 4.22–5.81)
RDW: 13 % (ref 11.5–15.5)
WBC: 11.9 10*3/uL — ABNORMAL HIGH (ref 4.0–10.5)
nRBC: 0 % (ref 0.0–0.2)

## 2024-02-22 LAB — COMPREHENSIVE METABOLIC PANEL WITH GFR
ALT: 21 U/L (ref 0–44)
AST: 19 U/L (ref 15–41)
Albumin: 4.3 g/dL (ref 3.5–5.0)
Alkaline Phosphatase: 82 U/L (ref 38–126)
Anion gap: 12 (ref 5–15)
BUN: 25 mg/dL — ABNORMAL HIGH (ref 6–20)
CO2: 27 mmol/L (ref 22–32)
Calcium: 9.1 mg/dL (ref 8.9–10.3)
Chloride: 106 mmol/L (ref 98–111)
Creatinine, Ser: 0.96 mg/dL (ref 0.61–1.24)
GFR, Estimated: >60 mL/min
Glucose, Bld: 90 mg/dL (ref 70–99)
Potassium: 3.8 mmol/L (ref 3.5–5.1)
Sodium: 144 mmol/L (ref 135–145)
Total Bilirubin: 0.4 mg/dL (ref 0.0–1.2)
Total Protein: 7.2 g/dL (ref 6.5–8.1)

## 2024-02-22 LAB — POCT URINE DRUG SCREEN - MANUAL ENTRY (I-SCREEN)
POC Amphetamine UR: NOT DETECTED
POC Buprenorphine (BUP): NOT DETECTED
POC Cocaine UR: NOT DETECTED
POC Marijuana UR: POSITIVE — AB
POC Methadone UR: NOT DETECTED
POC Methamphetamine UR: NOT DETECTED
POC Morphine: NOT DETECTED
POC Oxazepam (BZO): NOT DETECTED
POC Oxycodone UR: NOT DETECTED
POC Secobarbital (BAR): NOT DETECTED

## 2024-02-22 LAB — LIPID PANEL
Cholesterol: 200 mg/dL (ref 0–200)
HDL: 78 mg/dL
LDL Cholesterol: 101 mg/dL — ABNORMAL HIGH (ref 0–99)
Total CHOL/HDL Ratio: 2.6 ratio
Triglycerides: 107 mg/dL
VLDL: 21 mg/dL (ref 0–40)

## 2024-02-22 LAB — HEMOGLOBIN A1C
Hgb A1c MFr Bld: 5.7 % — ABNORMAL HIGH (ref 4.8–5.6)
Mean Plasma Glucose: 116.89 mg/dL

## 2024-02-22 MED ORDER — VALBENAZINE TOSYLATE 40 MG PO CAPS
40.0000 mg | ORAL_CAPSULE | Freq: Every day | ORAL | Status: AC
  Administered 2024-02-22 – 2024-03-02 (×10): 40 mg via ORAL
  Filled 2024-02-22 (×10): qty 1

## 2024-02-22 MED ORDER — ALUM & MAG HYDROXIDE-SIMETH 200-200-20 MG/5ML PO SUSP: 30.0000 mL | ORAL | Status: AC | PRN

## 2024-02-22 MED ORDER — MAGNESIUM HYDROXIDE 400 MG/5ML PO SUSP: 30.0000 mL | Freq: Every day | ORAL | Status: AC | PRN

## 2024-02-22 MED ORDER — OLANZAPINE 5 MG PO TBDP
5.0000 mg | ORAL_TABLET | Freq: Three times a day (TID) | ORAL | Status: AC | PRN
  Administered 2024-02-22 – 2024-02-24 (×4): 5 mg via ORAL
  Filled 2024-02-22 (×5): qty 1

## 2024-02-22 MED ORDER — OLANZAPINE 10 MG IM SOLR: 5.0000 mg | Freq: Three times a day (TID) | INTRAMUSCULAR | Status: AC | PRN

## 2024-02-22 MED ORDER — ARIPIPRAZOLE 5 MG PO TABS
5.0000 mg | ORAL_TABLET | Freq: Every day | ORAL | Status: DC
  Administered 2024-02-22 – 2024-02-23 (×2): 5 mg via ORAL
  Filled 2024-02-22 (×2): qty 1

## 2024-02-22 MED ORDER — ACETAMINOPHEN 325 MG PO TABS
650.0000 mg | ORAL_TABLET | Freq: Four times a day (QID) | ORAL | Status: AC | PRN
  Administered 2024-02-22 – 2024-02-23 (×2): 650 mg via ORAL
  Filled 2024-02-22 (×2): qty 2

## 2024-02-22 MED ORDER — DULOXETINE HCL 30 MG PO CPEP
30.0000 mg | ORAL_CAPSULE | Freq: Every day | ORAL | Status: DC
  Administered 2024-02-23: 30 mg via ORAL
  Filled 2024-02-22: qty 1

## 2024-02-22 MED ORDER — TRAZODONE HCL 50 MG PO TABS
50.0000 mg | ORAL_TABLET | Freq: Every evening | ORAL | Status: AC | PRN
  Administered 2024-02-22 – 2024-03-02 (×8): 50 mg via ORAL
  Filled 2024-02-22 (×8): qty 1

## 2024-02-22 NOTE — Telephone Encounter (Signed)
 Someone calling for him, needs to speak to amber about FL2 form being filled out. Want to know if its ready to be picked up, it is concerning his placement in a home. Needs asap.
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
## 2024-02-22 NOTE — Care Management (Signed)
 OBS Care Management   Participants involved in patient care for placement: DSS guardian Doyal Serum @ [phone] [facility]) Family friend Lawyer Alderman @ [phone]  Potential Group Home ([facility])    Plan for discharge: Waiting on [facility] to send the patient's medical records and FL 2 to the DSS legal guardian A group home has been identified and is willing to take the patient after the medical records and [facility] 2 Team Meeting scheduled for Tuesday February 28, 2024 at 10am

## 2024-02-22 NOTE — ED Notes (Signed)
 Pt resting in bed, no acute distress noted. Respirations even and unlabored. Continue to monitor for safety.

## 2024-02-22 NOTE — ED Notes (Signed)
 Pt Calm, cooperative throughout interview process. Skin assessment completed. Oriented to unit. Meal and drink offered. At currrent, pt denies SI/HI/AVH. Pt verbally contract for safety. Will monitor for safety.

## 2024-02-22 NOTE — BH Assessment (Addendum)
 Comprehensive Clinical Assessment (CCA) Note  02/22/2024 Frank Fernandez [id number]  Disposition: Per Dr. Prentice Espy,  admission to Continuous Assessment at [facility] is recommended for further monitoring and time to devise a safe discharge plan.  Patient will be reassessed by psychiatry in the morning to determine a safe discharge plan.   Disposition SW at [facility] will assist tomorrow when SW Doyal arrives.   The patient demonstrates the following risk factors for suicide: Chronic risk factors for suicide include: psychiatric disorder of Huntington's and medical illness Huntington's. Acute risk factors for suicide include: loss (financial, interpersonal, professional) and worsening Huntington's symptoms/condition. Protective factors for this patient include: positive social support, positive therapeutic relationship, and hope for the future. Considering these factors, the overall suicide risk at this point appears to be low. Patient is appropriate for outpatient follow up.   Patient is a 44 y.o. male with a hx of Huntington's disease who presents via [facility] from his [facility] facility reporting he just need my meds.  I'm ready to go back home.  He states his aunt is his legal guardian(clinician unable to understand aunt's name d/t pt's speech difficulties),  He does provide her number, [phone].  Patient states he now lives in a care home with Ms. Tillman.  She helps me cook my food and clean.  It appears that due to worsening of Huntington's symptoms, he can no longer care for himself.  He states he is the only client in the home with Ms. Tillman and [place].   Patient denies SI, HI and AVH.  Again, he is asking for his medications (which Aunt can confirm), and to return home.  Dr. Espy spoke with patient's LG with DSS, Doyal Shazlip([phone]).  Doyal reports patient is actually living in a boarding house in [place].  She states they are working on the [facility] process and placement in a facility.  Doyal reports  patient has been agitated, cursing out other residents at the boarding house.  He is having more frequent falls d/t worsening Huntington's condition.  Patient has also been increasingly agitated and aggressive towards others in the house.  Doyal reports they are pursuing placement at [facility].  She plans to visit [facility] tomorrow, to meet with disposition SW for coordination/planning.    Chief Complaint: No chief complaint on file.  Visit Diagnosis: Huntington's  Disease   CCA Screening, Triage and Referral (STR)  Patient Reported Information How did you hear about us ? Legal System  What Is the Reason for Your Visit/Call Today? Patient is a 44 y.o. male with a hx of Huntington's disease who presents via [facility] from his [facility] facility reporting he just need my meds.  I'm ready to go back home.  He states his aunt is his legal guardian(clinician unable to understand aunt's name d/t pt's speech difficulties),  He does provide her number, [phone].  Patient states he now lives in a care home with Ms. Tillman.  She helps me cook my food and clean.  It appears that due to worsening of Huntington's symptoms, he can no longer care for himself.  He states he is the only client in the home with Ms. Tillman and [place].   Patient denies SI, HI and AVH.  Again, he is asking for his medications (which Aunt can confirm), and to return home.  How Long Has This Been Causing You Problems? 1 wk - 1 month  What Do You Feel Would Help You the Most Today? Medication(s)   Have You Recently  Had Any Thoughts About Hurting Yourself? No  Are You Planning to Commit Suicide/Harm Yourself At This time? No   Flowsheet Row ED from 02/22/2024 in [facility] ED from 03/15/2023 in [facility] Emergency Department at [facility] ED from 03/07/2023 in [facility] Emergency Department at [facility]  C-SSRS RISK CATEGORY No Risk No Risk Error: Q7 should not be populated when Q6 is No     Have you Recently Had Thoughts About Hurting Someone [name]? No  Are You Planning to Harm Someone at This Time? No  Explanation: N/A   Have You Used Any Alcohol or Drugs in the Past 24 Hours? No  How Long Ago Did You Use Drugs or Alcohol? N/A What Did You Use and How Much? N/A  Do You Currently Have a Therapist/Psychiatrist? No  Name of Therapist/Psychiatrist:    Have You Been Recently Discharged From Any Office Practice or Programs? No  Explanation of Discharge From Practice/Program: N/A    CCA Screening Triage Referral Assessment Type of Contact: Face-to-Face  Telemedicine Service Delivery:   Is this Initial or Reassessment?   Date Telepsych consult ordered in [facility]:    Time Telepsych consult ordered in [facility]:    Location of Assessment: [facility] Assessment Services  Provider Location: [facility] Assessment Services   Collateral Involvement: Provider to speak with patient's aunt, whom he reports is LG.   Does Patient Have a Automotive Engineer Guardian? Yes  Legal Guardian Contact Information: [place] [phone]  Copy of Legal Guardianship Form: No - copy requested  Legal Guardian Notified of Arrival: Successfully notified  Legal Guardian Notified of Pending Discharge: Successfully notified  If Minor and Not Living with Parent(s), Who has Custody? n/a  Is CPS involved or ever been involved? Never  Is APS involved or ever been involved? Never   Patient Determined To Be At Risk for Harm To Self or Others Based on Review of Patient Reported Information or Presenting Complaint? No  Method: -- (N/A, no HI)  Availability of Means: -- (N/A, no HI)  Intent: -- (N/A, no HI)  Notification Required: -- (N/A, no HI)  Additional Information for Danger to Others Potential: -- (N/A, no HI)  Additional Comments for Danger to Others Potential: N/A, no HI  Are There Guns or Other Weapons in Your Home? No  Types of Guns/Weapons: n/a  Are These Weapons Safely Secured?                             -- (n/a)  Who Could Verify You Are Able To Have These Secured: n/a  Do You Have any Outstanding Charges, Pending Court Dates, Parole/Probation? None reported  Contacted To Inform of Risk of Harm To Self or Others: -- (N/A, no HI)    Does Patient Present under Involuntary Commitment? No    [place] of Residence: [place]   Patient Currently Receiving the Following Services: Medication Management   Determination of Need: Urgent (48 hours)   Options For Referral: Medication Management; Outpatient Therapy; BH Urgent Care     CCA Biopsychosocial Patient Reported Schizophrenia/Schizoaffective Diagnosis in Past: No   Strengths: Patient has support from aunt, whom pt states is his LG.  He states he is compliant with Rx medications.   Mental Health Symptoms Depression:  None   Duration of Depressive symptoms:    Mania:  None   Anxiety:   None   Psychosis:  None  Duration of Psychotic symptoms:    Trauma:  Detachment from others; Emotional numbing; Hypervigilance   Obsessions:  None   Compulsions:  None   Inattention:  N/A   Hyperactivity/Impulsivity:  N/A   Oppositional/Defiant Behaviors:  N/A   Emotional Irregularity:  Mood lability   Other Mood/Personality Symptoms:  NA    Mental Status Exam Appearance and self-care  Stature:  Average   Weight:  Average weight   Clothing:  Casual   Grooming:  Normal   Cosmetic use:  None   Posture/gait:  Rigid; Other (Comment) (unsteady gait d/t huntington's)   Motor activity:  Restless   Sensorium  Attention:  Normal   Concentration:  Normal   Orientation:  Object; Person; Place; Time   Recall/memory:  Defective in Short-term   Affect and Mood  Affect:  Anxious   Mood:  Anxious   Relating  Eye contact:  Fleeting   Facial expression:  Anxious; Constricted   Attitude toward examiner:  Cooperative   Thought and Language  Speech flow: Articulation error (related to  Huntington's)   Thought content:  Appropriate to Mood and Circumstances   Preoccupation:  None   Hallucinations:  None   Organization:  Intact   Company Secretary of Knowledge:  Average   Intelligence:  Average   Abstraction:  Concrete   Judgement:  Impaired   Reality TestingAnimator; Variable   Insight:  Flashes of insight   Decision Making:  Impulsive   Social Functioning  Social Maturity:  Isolates   Social Judgement:  Impropriety   Stress  Stressors:  Housing; Office Manager Ability:  Exhausted   Skill Deficits:  Decision making; Communication; Interpersonal; Self-care   Supports:  Friends/Service system; Family     Religion: Religion/Spirituality Are You A Religious Person?: No How Might This Affect Treatment?: n/a  Leisure/Recreation: Leisure / Recreation Do You Have Hobbies?: No  Exercise/Diet: Exercise/Diet Do You Exercise?: No Have You Gained or Lost A Significant Amount of Weight in the Past Six Months?: No Do You Follow a Special Diet?: No Do You Have Any Trouble Sleeping?: Yes Explanation of Sleeping Difficulties: varies   CCA Employment/Education Employment/Work Situation: Employment / Work Situation Employment Situation: Unemployed Patient's Job has Been Impacted by Current Illness: No Has Patient ever Been in Equities Trader?: No  Education: Education Is Patient Currently Attending School?: No Last Grade Completed: 12 Did You Product Manager?: No Did You Have An Individualized Education Program (IIEP): No Did You Have Any Difficulty At [employer]?: No Patient's Education Has Been Impacted by Current Illness: No   CCA Family/Childhood History Family and Relationship History: Family history Marital status: Single Does patient have children?: No  Childhood History:  Childhood History By whom was/is the patient raised?: Mother Did patient suffer any verbal/emotional/physical/sexual abuse as a child?: No Did  patient suffer from severe childhood neglect?: No Has patient ever been sexually abused/assaulted/raped as an adolescent or adult?: No Was the patient ever a victim of a crime or a disaster?: No Witnessed domestic violence?: No Has patient been affected by domestic violence as an adult?: No       CCA Substance Use Alcohol/Drug Use: Alcohol / Drug Use Pain Medications: See MAR Prescriptions: See MAR Over the Counter: See MAR History of alcohol / drug use?: No history of alcohol / drug abuse                         ASAM's:  Six Dimensions of Multidimensional Assessment  Dimension 1:  Acute Intoxication and/or Withdrawal Potential:      Dimension 2:  Biomedical Conditions and Complications:      Dimension 3:  Emotional, Behavioral, or Cognitive Conditions and Complications:     Dimension 4:  Readiness to Change:     Dimension 5:  Relapse, Continued use, or Continued Problem Potential:     Dimension 6:  Recovery/Living Environment:     ASAM Severity Score:    ASAM Recommended Level of Treatment:     Substance use Disorder (SUD)    Recommendations for Services/Supports/Treatments:    Disposition Recommendation per psychiatric provider: We recommend transfer to [facility]. Admit to Continuous Assessment.    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

## 2024-02-22 NOTE — ED Notes (Signed)
 Pt denies SI/HI/AVH. Back pain managed with prn tylenol . No scheduled meds this shift; prn trazodone  and zyprexa  given. He was anxious, cooperative, fidgety, and pleasant. No behavioral concerns at this time. Pt sleeping in bed, no acute distress noted. Respirations even and unlabored. Continue to monitor for safety.
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
## 2024-02-23 MED ORDER — DIAZEPAM 5 MG PO TABS
10.0000 mg | ORAL_TABLET | Freq: Once | ORAL | Status: AC
  Administered 2024-02-23: 10 mg via ORAL
  Filled 2024-02-23: qty 2

## 2024-02-23 MED ORDER — NAPROXEN 500 MG PO TABS
500.0000 mg | ORAL_TABLET | Freq: Two times a day (BID) | ORAL | Status: AC
  Administered 2024-02-23 – 2024-02-24 (×3): 500 mg via ORAL
  Filled 2024-02-23 (×3): qty 1

## 2024-02-23 NOTE — ED Notes (Signed)
 Patient was wandering around the room he would not sit down when asked, he said  he did not want to sit down, witness has been telling patient all day that he could fall If he keeps walking around the room, he is very unsteady on his feet, this witness has been cleaning up his surroundings all day, patient is very messy. Let nurse know patient kept walking around the room with high potential to fall.

## 2024-02-23 NOTE — ED Notes (Signed)
 Post Fall Documentation   02/23/24 1538  Vitals  Temp 97.6 F (36.4 C)  Temp Source Oral  BP (!) 117/95  MAP (mmHg) 103  BP Location Left Arm  BP Method Automatic  Patient Position (if appropriate) Sitting  Pulse Rate 63  Pulse Rate Source Dinamap  Resp 18  Level of Consciousness  Level of Consciousness Alert  MEWS COLOR  MEWS Score Color Green  Oxygen Therapy  SpO2 99 %  O2 Device Room Air  Patient Activity (if Appropriate) In chair  Pulse Oximetry Type Intermittent  Pain Assessment  Pain Scale 0-10  Pain Score 10  Pain Location Hip  Pain Descriptors / Indicators Aching;Discomfort  Pain Intervention(s) RN made aware;MD notified (Comment)  Multiple Pain Sites No  MEWS Score  MEWS Temp 0  MEWS Systolic 0  MEWS Pulse 0  MEWS RR 0  MEWS LOC 0  MEWS Score 0  Provider Notification  Provider Name/Title Kandi Hahn, MD  Date Provider Notified 02/23/24  Time Provider Notified 1541  Method of Notification Virtual Face-to-face (secure chat)  Provider response In department;Evaluate remotely  Date of Provider Response 02/23/24  Time of Provider Response 1541

## 2024-02-23 NOTE — ED Notes (Signed)
 Patient in bed. Pt calm, fidgety, pacing, anxious. Prn zyprexa  administered. Pt c/o of 4/10 back pain. Prn tylenol  administered.  Pt had breakfast. Patient in no apparent acute distress. No inappropriate behaviors observed or reported at this time. Environment secured. Safety checks in place per facility protocol. Meds administered per providers orders.

## 2024-02-23 NOTE — ED Notes (Signed)
 Pt sleeping in bed, no acute distress noted. Respirations even and unlabored. Continue to monitor for safety.
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
## 2024-02-23 NOTE — Telephone Encounter (Signed)
 Patient called DSS and I have faxed over the FL2 for the patient to the number left by DSS worker

## 2024-02-23 NOTE — ED Notes (Signed)
 Pt had an unwitnessed fall, pt denies hitting head, vitals taken, no injuries or open areas observed at [facility] stime. Pt c/o of hip pain 10. Dr. Cole made aware, prn meds ordered refer to [facility]

## 2024-02-24 MED ORDER — CHLORDIAZEPOXIDE HCL 5 MG PO CAPS: 5.0000 mg | ORAL_CAPSULE | Freq: Two times a day (BID) | ORAL | Status: DC

## 2024-02-24 MED ORDER — DIAZEPAM 5 MG PO TABS
7.5000 mg | ORAL_TABLET | Freq: Two times a day (BID) | ORAL | Status: DC
  Administered 2024-02-24 – 2024-02-25 (×3): 7.5 mg via ORAL
  Filled 2024-02-24 (×3): qty 2

## 2024-02-24 NOTE — ED Notes (Signed)
 RN spoke with patient A&Ox4. Denies intent to harm self/others when asked. Denies A/VH or any physical complaints when asked. No acute distress noted. Active listening, support and encouragement provided. Routine safety checks conducted according to facility protocol. Encouraged patient to notify staff if thoughts of harm toward self or others arise. Patient verbalize understanding and agreement. Snack and beverages offered.

## 2024-02-24 NOTE — ED Notes (Signed)
 Pt sleeping at present, no distress noted.  Monitoring for safety.

## 2024-02-24 NOTE — ED Notes (Signed)
 Patient up and down, eating/drinking, walking around flex area. No s/s of distress.

## 2024-02-24 NOTE — ED Notes (Signed)
 Pt refused am vitals

## 2024-02-24 NOTE — ED Notes (Signed)
 Patient alert & oriented x3. Pt calm in bed, at times observed pacing on the unit. Pt on unit q15 checks in place. Pt observed while ambulating as his high fall risk. Pt given prn zyprexa . Pt denies intent to harm self or others. Denies AVH. No signs of acute distress noted. No inappropriate behaviors observed or reported. Encouraged patient to notify staff if any thoughts of harm towards self or others arise. Patient verbalizes understanding and agreement. Eating and fluid intake were adequate. Medications administered as ordered; no adverse effects noted.

## 2024-02-24 NOTE — ED Notes (Signed)
 Patient alert, Mood appears calm. Respirations even and unlabored. No acute distress observed.  Resting in recliner asleep. Environment secured. Poc ongoing
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
## 2024-02-24 NOTE — ED Notes (Signed)
 Pt observed/assessed in recliner sleeping. RR even and unlabored, appearing in no noted distress. Environmental check complete, will continue to monitor for safety

## 2024-02-24 NOTE — ED Notes (Signed)
 Pt was provided breakfast.

## 2024-02-25 MED ORDER — OLANZAPINE 5 MG PO TBDP: 5.0000 mg | ORAL_TABLET | Freq: Two times a day (BID) | ORAL | Status: DC

## 2024-02-25 MED ORDER — DIAZEPAM 5 MG PO TABS: 7.5000 mg | ORAL_TABLET | Freq: Two times a day (BID) | ORAL | Status: DC

## 2024-02-25 MED ORDER — OLANZAPINE 5 MG PO TBDP
5.0000 mg | ORAL_TABLET | Freq: Two times a day (BID) | ORAL | Status: AC
  Administered 2024-02-25 – 2024-03-02 (×14): 5 mg via ORAL
  Filled 2024-02-25 (×13): qty 1

## 2024-02-25 MED ORDER — NICOTINE 21 MG/24HR TD PT24
21.0000 mg | MEDICATED_PATCH | Freq: Every day | TRANSDERMAL | Status: AC
  Administered 2024-02-26 – 2024-03-02 (×6): 21 mg via TRANSDERMAL
  Filled 2024-02-25 (×6): qty 1

## 2024-02-25 NOTE — ED Notes (Signed)
 Patient appears to be resting with equal and unlabored respirations. No appeared or reported distress. Monitor for safety.

## 2024-02-25 NOTE — Progress Notes (Addendum)
 It was reported to writer that pt fell @1113 . Per MHT, pt did not hit his head. Pt has Huntington's Disease, has gross tremors on all extremeties and is a high fall risk. Pt needs constant redirection and is difficult to redirect. Unable to obtain vital signs due to pt being hyperactive and is not compliant. Staff will monitor for pt's safety.

## 2024-02-25 NOTE — ED Notes (Signed)
 Patient ambulated independently with staff at side for caution to bathroom.

## 2024-02-25 NOTE — Progress Notes (Addendum)
 Pt is awake, alert and oriented. Pt has gross tremors in all extremities and refuses to sit or lay down. Pt is aspiration and high fall risk. Pt was observed gurgling while taking his medicine. Pt keep insisting to be discharged. Pt is difficult to redirect. Administered scheduled meds per order. Pt needs 1:1 monitoring. MD was notified of pt's behavior and safety concerns. Staff will monitor for pt's safety.

## 2024-02-25 NOTE — ED Notes (Signed)
 Writer witnessed patient fall at 11:13 am. Patient fell on his bottom and back. RN Notified. Will continue to monitor for safety.
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
## 2024-02-25 NOTE — ED Notes (Signed)
 Patient cooperative with taking scheduled medication and agreeable to taking PRN trazodone . Provided snack and beverage. Multiple education attempts to limit getting up without staff assistance, however patient continues to walk to window without staff. No appeared or reported distress. Monitor for safety.

## 2024-02-25 NOTE — Progress Notes (Signed)
 Pt has been sleeping on and off this shift. No signs of acute distress noted.Pt continues to require constant redirection. Staff will monitor for pt's safety.

## 2024-02-25 NOTE — Progress Notes (Addendum)
 At approximately 1805, writer heard a loud noise and saw pt on the floor beside his recliner. Pt said that he fell, however denies hitting his head. Fall was unwitnessed. Pt reported hitting his left hip. Denies pain. VS obtained. MD notified. No new orders.

## 2024-02-26 MED ORDER — IBUPROFEN 600 MG PO TABS
600.0000 mg | ORAL_TABLET | Freq: Four times a day (QID) | ORAL | Status: AC | PRN
  Administered 2024-02-26 – 2024-02-27 (×2): 600 mg via ORAL
  Filled 2024-02-26 (×2): qty 1

## 2024-02-26 NOTE — ED Provider Notes (Signed)
 Behavioral Health Progress Note  Date and Time: 02/26/2024 9:00 AM Name: Frank Fernandez MRN:  [id number]  Subjective:  I'm great  Diagnosis:  Final diagnoses:  Huntington's disease (HCC)  Delusions of parasitosis (HCC)  PTSD (post-traumatic stress disorder)   Chart reviewed and discussed with attending psychiatrist, Dr Frank Fernandez.   Patient standing at the nurse's station on rounds this morning. Patient is eating breakfast. States I'm great and states he slept well overnight. He is asked about his relationship with Frank Fernandez who has called the facility and he states she is my worker and gives verbal permission for this practitioner to speak with her. Pt c/o lower back pain. Non-tender to palpation. Pt exhibiting involuntary, irregular movements of his trunk and limbs which interferes with purposeful ambulation. Pt denies suicidal or homicidal ideation, intent or plan. Denies AVH.   Spoke with Frank Fernandez [phone] who states she is the patient's spiritual mother. States patient has an interim guardian through DSS, Frank Fernandez [phone] (work). Fernandez states currently awaiting FL2 paperwork from PCP which was submitted last week. States there is a treatment team meeting scheduled for 2/3 with [facility] social worker. Fernandez states she currently lives in [place] and plans to advocate for placement in [place] so that I can be near to check on him. Frank Fernandez states patient was non-adherent with psychotropics prior to admission with exception of Austedo which he was adherent with. States Austedo was last filled in October and has been off since November due to a lapse in insurance. Frank Fernandez was advised that pt has been started on Ingrezza  and is tolerating well thus far.  Total Time spent with patient: 10 mins  Substance Use Hx: Alcohol: denies             Hx of seizures/Dts: denies Tobacco: endorse Cannabis:endorses Stimulants: endorses history of cocaine   abuse but denies recent use Ecstasy (MDMA / molly): denies Hallucinogens: denies Opiates (fentanyl  / heroin): denies Sedatives (Xanax, Klonopin): denies IVDU: denies Prescribed meds abuse: denies Detox hx: denies Rehab hx: denies   Past Psychiatric Hx: Dx: Depression, PTSD Current Psychiatrist: none Current Therapist: none Previous medication trials: duloxetine , abilify , austedo Psychiatric Hospitalization hx: once several years ago for severe depression Psychotherapy hx: denies Neuromodulation history: denies History of suicide (obtained from HPI): denies History of homicide or aggression (obtained in HPI): denies   Past Medical History:  has a past medical history of Chorea, Huntington disease, Right shoulder pain.  PCP: Frank Gauze, NP Social History: Was living  in a trailer in [place] with a CNA (Frank Fernandez) who was providing care and cooking meals for patient. Pt lived at trailer for 11 days before being IVC'd due to being belligerent, defiant, listening to porn and smoking in his room, threatening and using profanity Has 4 children including a set of twins  Additional Social History:    Pain Medications: See MAR Prescriptions: See MAR Over the Counter: See MAR History of alcohol / drug use?: No history of alcohol / drug abuse    Sleep: Improving  Appetite:  Good  Current Medications:  Current Facility-Administered Medications  Medication Dose Route Frequency Provider Last Rate Last Admin   acetaminophen  (TYLENOL ) tablet 650 mg  650 mg Oral Q6H PRN Frank Barter, MD   650 mg at 02/23/24 0915   alum & mag hydroxide-simeth (MAALOX/MYLANTA) 200-200-20 MG/5ML suspension 30 mL  30 mL Oral Q4H PRN Frank Barter, MD       ibuprofen  (ADVIL ) tablet 600 mg  600 mg Oral  Q6H PRN Frank Maris E, NP       magnesium  hydroxide (MILK OF MAGNESIA) suspension 30 mL  30 mL Oral Daily PRN Frank Barter, MD       nicotine  (NICODERM CQ  - dosed in mg/24 hours) patch 21 mg  21 mg Transdermal  Daily Fernandez, Frank C, MD   21 mg at 02/26/24 9070   OLANZapine  (ZYPREXA ) injection 5 mg  5 mg Intramuscular TID PRN Frank Barter, MD       OLANZapine  zydis (ZYPREXA ) disintegrating tablet 5 mg  5 mg Oral TID PRN Frank Barter, MD   5 mg at 02/24/24 2335   OLANZapine  zydis (ZYPREXA ) disintegrating tablet 5 mg  5 mg Oral BID Fernandez, Frank C, MD   5 mg at 02/26/24 9070   traZODone  (DESYREL ) tablet 50 mg  50 mg Oral QHS PRN Frank Barter, MD   50 mg at 02/25/24 1941   valbenazine  (INGREZZA ) capsule 40 mg  40 mg Oral Daily Fernandez, Andrew, MD   40 mg at 02/26/24 9070   No current outpatient medications on file.    Labs  Lab Results:  Admission on 02/22/2024  Component Date Value Ref Range Status   WBC 02/22/2024 11.9 (H)  4.0 - 10.5 K/uL Final   RBC 02/22/2024 4.79  4.22 - 5.81 MIL/uL Final   Hemoglobin 02/22/2024 15.2  13.0 - 17.0 g/dL Final   HCT 98/71/7973 45.3  39.0 - 52.0 % Final   MCV 02/22/2024 94.6  80.0 - 100.0 fL Final   MCH 02/22/2024 31.7  26.0 - 34.0 pg Final   MCHC 02/22/2024 33.6  30.0 - 36.0 g/dL Final   RDW 98/71/7973 13.0  11.5 - 15.5 % Final   Platelets 02/22/2024 207  150 - 400 K/uL Final   nRBC 02/22/2024 0.0  0.0 - 0.2 % Final   Neutrophils Relative % 02/22/2024 86  % Final   Neutro Abs 02/22/2024 10.2 (H)  1.7 - 7.7 K/uL Final   Lymphocytes Relative 02/22/2024 10  % Final   Lymphs Abs 02/22/2024 1.1  0.7 - 4.0 K/uL Final   Monocytes Relative 02/22/2024 4  % Final   Monocytes Absolute 02/22/2024 0.4  0.1 - 1.0 K/uL Final   Eosinophils Relative 02/22/2024 0  % Final   Eosinophils Absolute 02/22/2024 0.0  0.0 - 0.5 K/uL Final   Basophils Relative 02/22/2024 0  % Final   Basophils Absolute 02/22/2024 0.0  0.0 - 0.1 K/uL Final   Immature Granulocytes 02/22/2024 0  % Final   Abs Immature Granulocytes 02/22/2024 0.04  0.00 - 0.07 K/uL Final   Performed at [facility], [address], [place], [place] [address]   Hgb A1c MFr Bld 02/22/2024 5.7 (H)  4.8 - 5.6 % Final    Comment: (NOTE) Diagnosis of Diabetes The following HbA1c ranges recommended by the American Diabetes Association (ADA) may be used as an aid in the diagnosis of diabetes mellitus.  Hemoglobin             Suggested A1C NGSP%              Diagnosis  <5.7                   Non Diabetic  5.7-6.4                Pre-Diabetic  >6.4                   Diabetic  <7.0  Glycemic control for                       adults with diabetes.     Mean Plasma Glucose 02/22/2024 116.89  mg/dL Final   Performed at [facility], [address], [place], [place] [address]   Sodium 02/22/2024 144  135 - 145 mmol/L Final   Potassium 02/22/2024 3.8  3.5 - 5.1 mmol/L Final   Chloride 02/22/2024 106  98 - 111 mmol/L Final   CO2 02/22/2024 27  22 - 32 mmol/L Final   Glucose, Bld 02/22/2024 90  70 - 99 mg/dL Final   Glucose reference range applies only to samples taken after fasting for at least 8 hours.   BUN 02/22/2024 25 (H)  6 - 20 mg/dL Final   Creatinine, Ser 02/22/2024 0.96  0.61 - 1.24 mg/dL Final   Calcium 98/71/7973 9.1  8.9 - 10.3 mg/dL Final   Total Protein 98/71/7973 7.2  6.5 - 8.1 g/dL Final   Albumin 98/71/7973 4.3  3.5 - 5.0 g/dL Final   AST 98/71/7973 19  15 - 41 U/L Final   ALT 02/22/2024 21  0 - 44 U/L Final   Alkaline Phosphatase 02/22/2024 82  38 - 126 U/L Final   Total Bilirubin 02/22/2024 0.4  0.0 - 1.2 mg/dL Final   GFR, Estimated 02/22/2024 >60  >60 mL/min Final   Comment: (NOTE) Calculated using the CKD-EPI Creatinine Equation (2021)    Anion gap 02/22/2024 12  5 - 15 Final   Performed at [facility], [address] [place], [place] [address]   Cholesterol 02/22/2024 200  0 - 200 mg/dL Final   Comment:        ATP III CLASSIFICATION:  <200     mg/dL   Desirable  799-760  mg/dL   Borderline High  >=759    mg/dL   High           Triglycerides 02/22/2024 107  <150 mg/dL Final   HDL 98/71/7973 78  >40 mg/dL Final   Total CHOL/HDL Ratio  02/22/2024 2.6  RATIO Final   VLDL 02/22/2024 21  0 - 40 mg/dL Final   LDL Cholesterol 02/22/2024 101 (H)  0 - 99 mg/dL Final   Comment:        Total Cholesterol/HDL:CHD Risk Coronary Heart Disease Risk Table                     Men   Women  1/2 Average Risk   3.4   3.3  Average Risk       5.0   4.4  2 X Average Risk   9.6   7.1  3 X Average Risk  23.4   11.0        Use the calculated Patient Ratio above and the CHD Risk Table to determine the patient's CHD Risk.        ATP III CLASSIFICATION (LDL):  <100     mg/dL   Optimal  899-870  mg/dL   Near or Above                    Optimal  130-159  mg/dL   Borderline  839-810  mg/dL   High  >809     mg/dL   Very High Performed at [facility], [address], [place], [place] [address]    POC Amphetamine UR 02/22/2024 None Detected  NONE DETECTED (Cut Off Level 1000 ng/mL) Final   POC Secobarbital (BAR) 02/22/2024 None Detected  NONE DETECTED (Cut Off Level 300 ng/mL) Final   POC Buprenorphine (BUP) 02/22/2024 None Detected  NONE DETECTED (Cut Off Level 10 ng/mL) Final   POC Oxazepam (BZO) 02/22/2024 None Detected  NONE DETECTED (Cut Off Level 300 ng/mL) Final   POC Cocaine  UR 02/22/2024 None Detected  NONE DETECTED (Cut Off Level 300 ng/mL) Final   POC Methamphetamine UR 02/22/2024 None Detected  NONE DETECTED (Cut Off Level 1000 ng/mL) Final   POC Morphine 02/22/2024 None Detected  NONE DETECTED (Cut Off Level 300 ng/mL) Final   POC Methadone UR 02/22/2024 None Detected  NONE DETECTED (Cut Off Level 300 ng/mL) Final   POC Oxycodone UR 02/22/2024 None Detected  NONE DETECTED (Cut Off Level 100 ng/mL) Final   POC Marijuana UR 02/22/2024 Positive (A)  NONE DETECTED (Cut Off Level 50 ng/mL) Final    Blood Alcohol level:  Lab Results  Component Value Date   ETH <10 03/07/2023   ETH <10 03/05/2023    Metabolic Disorder Labs: Lab Results  Component Value Date   HGBA1C 5.7 (H) 02/22/2024   MPG 116.89 02/22/2024   MPG 108.28  03/05/2023   No results found for: PROLACTIN Lab Results  Component Value Date   CHOL 200 02/22/2024   TRIG 107 02/22/2024   HDL 78 02/22/2024   CHOLHDL 2.6 02/22/2024   VLDL 21 02/22/2024   LDLCALC 101 (H) 02/22/2024   LDLCALC 98 12/16/2022    Therapeutic Lab Levels: No results found for: LITHIUM No results found for: VALPROATE No results found for: CBMZ  Physical Findings   GAD-7    Flowsheet Row Office Visit from 10/27/2022 in [facility]  Total GAD-7 Score 12   PHQ2-9    Flowsheet Row Office Visit from 10/27/2022 in [facility]  PHQ-2 Total Score 2  PHQ-9 Total Score 8   Flowsheet Row ED from 02/22/2024 in [facility] ED from 03/15/2023 in [facility] Emergency Department at [facility] ED from 03/07/2023 in [facility] Emergency Department at [facility]  C-SSRS RISK CATEGORY No Risk No Risk Error: Q7 should not be populated when Q6 is No     Musculoskeletal  Strength & Muscle Tone: at baseline Gait & Station: unsteady d/t chorea Patient leans: n/a*  Psychiatric Specialty Exam  Presentation  General Appearance: No data recorded Eye Contact:No data recorded Speech:No data recorded Speech Volume:Normal  Handedness:Right   Mood and Affect  Mood:Euthymic  Affect:Appropriate   Thought Process  Thought Processes:Coherent  Descriptions of Associations:Intact  Orientation:Full (Time, Place and Person)  Thought Content:Logical  Diagnosis of Schizophrenia or Schizoaffective disorder in past: No    Hallucinations:Hallucinations: None  Ideas of Reference:None  Suicidal Thoughts:Suicidal Thoughts: No  Homicidal Thoughts:Homicidal Thoughts: No   Sensorium  Memory:Immediate Good  Judgment:Fair  Insight:Fair   Executive Functions  Concentration:Fair  Attention Span:Fair  Recall:Fair  Fund of Knowledge:Fair  Language:Fair   Psychomotor Activity  Psychomotor  Activity:Psychomotor Activity: -- (symptoms of chorea with involuntary movements)   Assets  Assets:Communication Skills; Resilience; Social Support   Sleep  Sleep:Sleep: Good  No Safety Checks orders active in given range  No data recorded  Physical Exam  Physical Exam Vitals and nursing note reviewed.  HENT:     Head: Normocephalic.     Mouth/Throat:     Mouth: Mucous membranes are moist.  Cardiovascular:     Rate and Rhythm:  Normal rate.  Pulmonary:     Effort: Pulmonary effort is normal.  Skin:    General: Skin is warm and dry.  Neurological:     Mental Status: He is alert and oriented to person, place, and time.  Psychiatric:     Comments: See HPI    Review of Systems  Musculoskeletal:  Positive for back pain.  Psychiatric/Behavioral:  Negative for depression, hallucinations and suicidal ideas.    Blood pressure (!) 127/98, pulse 85, temperature 98.7 F (37.1 C), temperature source Oral, resp. rate 18, SpO2 99%. There is no height or weight on file to calculate BMI.  Treatment Plan Summary: Daily contact with patient to assess and evaluate symptoms and progress in treatment  Medication management Continue acetaminophen  650 mg PO Q6H prn mild pain Continue Maalox/Mylanta 30 ml PO Q4H prn indigestion Start Ibuprofen  600 mg PO Q6H prn moderate pain Continue MOM 30 ml PO daily prn mild constipation  Continue agitation protocol Continue zyprexa  5 mg PO BID Start nicotine  patch 21 mg transdermally daily Continue Trazodone  50 mg PO at bedtime prn sleep Continue Ingrezza  40 mg PO daily for chorea  Plan Continue in continuous assessment until placement is determined. Per notes from Ava Elouise, Care Management, on 02/22/2024 Waiting on [facility] to send the patient's medical records and FL 2 to the DSS legal guardian A group home has been identified and is willing to take the patient after the medical records and [facility] 2 Team Meeting scheduled for Tuesday  February 28, 2024 at [facility], PMHNP-BC, FNP-BC  02/26/2024 11:02 AM

## 2024-02-26 NOTE — ED Notes (Signed)
 RN spoke with patient A&Ox4, polite and approp. Denies intent to harm self/others when asked. Denies A/VH or any physical complaints when asked. No acute distress noted. Active listening, support and encouragement provided. Routine safety checks conducted according to facility protocol. Encouraged patient to notify staff if thoughts of harm toward self or others arise. Patient verbalize understanding and agreement.

## 2024-02-26 NOTE — ED Notes (Signed)
 Patient appears to be sleeping with equal and unlabored respirations. No appeared or reported distress. Monitor for safety.

## 2024-02-26 NOTE — ED Notes (Signed)
 Patient awoke this morning with improved mood and orientation.  Patient more cooperative with care and is responding appropriately to direction.  He ate breakfast  and the area around his bed was cleaned up for him.  Patient used the bathroom and his scrubs were changed for him.  Spastic movements continue due to progression of Huntington's disease.  Will assist to bathroom to prevent falls and monitor closely.

## 2024-02-27 NOTE — ED Notes (Signed)
 Pt is currently sleeping, no distress noted, environmental check complete, will continue to monitor patient for safety.

## 2024-02-27 NOTE — ED Notes (Signed)
 Patient is currently sitting in chair on unit, patient appears to be in no acute distress, patient is noted with uncontrollable arm movements due to Huntingtons disease. Will continue to monitor patient for safety.

## 2024-02-28 NOTE — ED Notes (Signed)
 Pt resting in recliner with eyes closed. No noted distress. Resp even and unlabored. Environment secure. Safety check complete pre facility protocol.

## 2024-02-28 NOTE — Care Management (Signed)
 Team Meeting Notes  Attendees:  - Zyannah Haizlip, DSS (Placement Social Worker) [phone]     Discussion Points:  Zyannah Haizlip reported that the patient's placement at [facility] is no longer viable. The facility has denied admission due to the patient's aggressive behaviors, which pose risks to himself, others, and property.  Consequently, the patient needs to be placed in a Skilled Nursing Facility (SNF).  The legal guardian is Director [employer] with DSS but she was not in attendance   Plan: Action Items: 1. The Child Psychotherapist (SW) will reach out to a family friend to explore the possibility of the patient staying there until an appropriate SNF is found. 2. The SW will begin referring the patient to other SNF facilities that can cater to his diagnosis of Huntington's disease, as he requires a high level of care. 3. The SW will also attempt to refer the patient to adult emergency facilities capable of meeting his medical needs. 4. The previous facility remains adamant about not re-admitting the patient, following their issuance of a 30-day notice.   Next Meeting The next team meeting is scheduled for Friday, March 09, 2024, at 10:00 AM.

## 2024-02-28 NOTE — ED Notes (Signed)
 Patient stated to this nurse  I am pissed off and will not be taking medication. Patient asked this nurse if he could have coffee, however per provider coffee makes patient more jittery increasing his fall risk. Patient has been made aware of this and advised that we are only trying to keep him safe.

## 2024-02-28 NOTE — ED Notes (Signed)
 Per communication with APP, [name], NP patient is not allowed to have coffee at this time. Patient has been informed and has verbalized understanding.

## 2024-02-28 NOTE — ED Notes (Signed)
 Patient upset because he wants a snack and coffee, the witness explained to patient dinner is in 30 minutes and he cannot have coffee as it was explained to him earlier.

## 2024-02-28 NOTE — ED Notes (Signed)
 Patient resting in lounger with eyes closed, respirations even and unlabored. Patient in no apparent acute distress. Environment secured. Safety checks in place per facility protocol.

## 2024-02-28 NOTE — ED Notes (Signed)
 Pt sleeping in bed, no acute distress noted. Respirations even and unlabored. Continue to monitor for safety.

## 2024-02-28 NOTE — ED Notes (Signed)
 Patient resting in lounger with eyes closed. Calm, collected, no physical complaints at this time. Patient in no apparent acute distress. No inappropriate behaviors observed or reported at this time. Environment secured. Safety checks in place per facility protocol.

## 2024-02-29 NOTE — ED Provider Notes (Signed)
 Behavioral Health Progress Note  Date and Time: 02/29/2024 10:09 AM Name: Frank Fernandez MRN:  [id number]   Diagnosis:  Final diagnoses:  Huntington's disease (HCC)  Delusions of parasitosis (HCC)  PTSD (post-traumatic stress disorder)   History of Present Illness (HPI): Per chart review, Frank Fernandez is a 44 year old male with a medical history significant for Huntingtons disease and a psychiatric history of stimulant use disorder (cocaine ), post-traumatic stress disorder, and major depressive disorder. He presented to [facility] on 01/28 after being brought in by the [facility]. Documentation from that date indicates the patient was exhibiting aggressive and unsafe behaviors at the residence where he was staying. Chart review further notes that the patient has been under the custody of the Department of Social Services for approximately two weeks, with DSS guardian identified as [name] [phone]), and that guardianship paperwork was expected to be provided the following day.  Assessment - 02/29/2024: On assessment today, patient was observed sitting in the flex observation area in no acute distress. Patient was calm and cooperative throughout the evaluation and denied any current complaints. He reported sleeping well overnight with good appetite and requested additional cereal during the interview. Patient denied depressive symptoms and denied suicidal ideation, homicidal ideation, auditory hallucinations, and visual hallucinations. Thought process appeared organized and goal directed. No acute behavioral concerns were noted. Choreiform movements were observed during the assessment, consistent with the patients diagnosis of Huntingtons disease. Patient was compliant with medications and reported tolerating current medications well without side effects.  Interdisciplinary Team Meeting Summary / Plan: [facility] social worker participated in a team meeting with  Zyannah Haizlip, DSS Placement Social Worker [phone]), on 02/28/2024. DSS reported that the patients placement at [facility] is no longer available due to the patient's aggressive behaviors. Patient requires placement in a Skilled Nursing Facility capable of managing Huntingtons disease and high care needs. Legal guardian is DSS Director [employer].  Plan: Social work will explore temporary placement with a family friend, initiate referrals to appropriate SNF facilities, and consider adult emergency placement options if needed.  Next team meeting scheduled for 03/09/2024 at 10:00 AM.   Total Time spent with patient: 30 minutes  Pain Medications: See MAR Prescriptions: See MAR Over the Counter: See MAR History of alcohol / drug use?: No history of alcohol / drug abuse     Sleep: Good  Appetite:  Good  Current Medications:  Current Facility-Administered Medications  Medication Dose Route Frequency Provider Last Rate Last Admin   acetaminophen  (TYLENOL ) tablet 650 mg  650 mg Oral Q6H PRN Lynnette Barter, MD   650 mg at 02/23/24 0915   alum & mag hydroxide-simeth (MAALOX/MYLANTA) 200-200-20 MG/5ML suspension 30 mL  30 mL Oral Q4H PRN Lynnette Barter, MD       ibuprofen  (ADVIL ) tablet 600 mg  600 mg Oral Q6H PRN Hobson, Fran E, NP   600 mg at 02/27/24 2109   magnesium  hydroxide (MILK OF MAGNESIA) suspension 30 mL  30 mL Oral Daily PRN Lynnette Barter, MD       nicotine  (NICODERM CQ  - dosed in mg/24 hours) patch 21 mg  21 mg Transdermal Daily Cole Kandi BROCKS, MD   21 mg at 02/29/24 9164   OLANZapine  (ZYPREXA ) injection 5 mg  5 mg Intramuscular TID PRN Lynnette Barter, MD       OLANZapine  zydis (ZYPREXA ) disintegrating tablet 5 mg  5 mg Oral TID PRN Lynnette Barter, MD   5 mg at 02/24/24 2335  OLANZapine  zydis (ZYPREXA ) disintegrating tablet 5 mg  5 mg Oral BID Bethea, Terrence C, MD   5 mg at 02/29/24 9165   traZODone  (DESYREL ) tablet 50 mg  50 mg Oral QHS PRN Lynnette Barter, MD   50 mg at 02/28/24  2121   valbenazine  (INGREZZA ) capsule 40 mg  40 mg Oral Daily Ji, Andrew, MD   40 mg at 02/29/24 9065   No current outpatient medications on file.    Labs  Lab Results:  Admission on 02/22/2024  Component Date Value Ref Range Status   WBC 02/22/2024 11.9 (H)  4.0 - 10.5 K/uL Final   RBC 02/22/2024 4.79  4.22 - 5.81 MIL/uL Final   Hemoglobin 02/22/2024 15.2  13.0 - 17.0 g/dL Final   HCT 98/71/7973 45.3  39.0 - 52.0 % Final   MCV 02/22/2024 94.6  80.0 - 100.0 fL Final   MCH 02/22/2024 31.7  26.0 - 34.0 pg Final   MCHC 02/22/2024 33.6  30.0 - 36.0 g/dL Final   RDW 98/71/7973 13.0  11.5 - 15.5 % Final   Platelets 02/22/2024 207  150 - 400 K/uL Final   nRBC 02/22/2024 0.0  0.0 - 0.2 % Final   Neutrophils Relative % 02/22/2024 86  % Final   Neutro Abs 02/22/2024 10.2 (H)  1.7 - 7.7 K/uL Final   Lymphocytes Relative 02/22/2024 10  % Final   Lymphs Abs 02/22/2024 1.1  0.7 - 4.0 K/uL Final   Monocytes Relative 02/22/2024 4  % Final   Monocytes Absolute 02/22/2024 0.4  0.1 - 1.0 K/uL Final   Eosinophils Relative 02/22/2024 0  % Final   Eosinophils Absolute 02/22/2024 0.0  0.0 - 0.5 K/uL Final   Basophils Relative 02/22/2024 0  % Final   Basophils Absolute 02/22/2024 0.0  0.0 - 0.1 K/uL Final   Immature Granulocytes 02/22/2024 0  % Final   Abs Immature Granulocytes 02/22/2024 0.04  0.00 - 0.07 K/uL Final   Performed at [facility], [address] [place], [place] [address]   Hgb A1c MFr Bld 02/22/2024 5.7 (H)  4.8 - 5.6 % Final   Comment: (NOTE) Diagnosis of Diabetes The following HbA1c ranges recommended by the American Diabetes Association (ADA) may be used as an aid in the diagnosis of diabetes mellitus.  Hemoglobin             Suggested A1C NGSP%              Diagnosis  <5.7                   Non Diabetic  5.7-6.4                Pre-Diabetic  >6.4                   Diabetic  <7.0                   Glycemic control for                       adults with diabetes.      Mean Plasma Glucose 02/22/2024 116.89  mg/dL Final   Performed at [facility], [address], [place], [place] [address]   Sodium 02/22/2024 144  135 - 145 mmol/L Final   Potassium 02/22/2024 3.8  3.5 - 5.1 mmol/L Final   Chloride 02/22/2024 106  98 - 111 mmol/L Final   CO2 02/22/2024  27  22 - 32 mmol/L Final   Glucose, Bld 02/22/2024 90  70 - 99 mg/dL Final   Glucose reference range applies only to samples taken after fasting for at least 8 hours.   BUN 02/22/2024 25 (H)  6 - 20 mg/dL Final   Creatinine, Ser 02/22/2024 0.96  0.61 - 1.24 mg/dL Final   Calcium 98/71/7973 9.1  8.9 - 10.3 mg/dL Final   Total Protein 98/71/7973 7.2  6.5 - 8.1 g/dL Final   Albumin 98/71/7973 4.3  3.5 - 5.0 g/dL Final   AST 98/71/7973 19  15 - 41 U/L Final   ALT 02/22/2024 21  0 - 44 U/L Final   Alkaline Phosphatase 02/22/2024 82  38 - 126 U/L Final   Total Bilirubin 02/22/2024 0.4  0.0 - 1.2 mg/dL Final   GFR, Estimated 02/22/2024 >60  >60 mL/min Final   Comment: (NOTE) Calculated using the CKD-EPI Creatinine Equation (2021)    Anion gap 02/22/2024 12  5 - 15 Final   Performed at [facility], [address] [place], [place] [address]   Cholesterol 02/22/2024 200  0 - 200 mg/dL Final   Comment:        ATP III CLASSIFICATION:  <200     mg/dL   Desirable  799-760  mg/dL   Borderline High  >=759    mg/dL   High           Triglycerides 02/22/2024 107  <150 mg/dL Final   HDL 98/71/7973 78  >40 mg/dL Final   Total CHOL/HDL Ratio 02/22/2024 2.6  RATIO Final   VLDL 02/22/2024 21  0 - 40 mg/dL Final   LDL Cholesterol 02/22/2024 101 (H)  0 - 99 mg/dL Final   Comment:        Total Cholesterol/HDL:CHD Risk Coronary Heart Disease Risk Table                     Men   Women  1/2 Average Risk   3.4   3.3  Average Risk       5.0   4.4  2 X Average Risk   9.6   7.1  3 X Average Risk  23.4   11.0        Use the calculated Patient Ratio above and the CHD Risk Table to determine the patient's  CHD Risk.        ATP III CLASSIFICATION (LDL):  <100     mg/dL   Optimal  899-870  mg/dL   Near or Above                    Optimal  130-159  mg/dL   Borderline  839-810  mg/dL   High  >809     mg/dL   Very High Performed at [facility], [address], [place], [place] [address]    POC Amphetamine UR 02/22/2024 None Detected  NONE DETECTED (Cut Off Level 1000 ng/mL) Final   POC Secobarbital (BAR) 02/22/2024 None Detected  NONE DETECTED (Cut Off Level 300 ng/mL) Final   POC Buprenorphine (BUP) 02/22/2024 None Detected  NONE DETECTED (Cut Off Level 10 ng/mL) Final   POC Oxazepam (BZO) 02/22/2024 None Detected  NONE DETECTED (Cut Off Level 300 ng/mL) Final   POC Cocaine  UR 02/22/2024 None Detected  NONE DETECTED (Cut Off Level 300 ng/mL) Final   POC Methamphetamine UR 02/22/2024 None Detected  NONE DETECTED (Cut Off Level 1000 ng/mL)  Final   POC Morphine 02/22/2024 None Detected  NONE DETECTED (Cut Off Level 300 ng/mL) Final   POC Methadone UR 02/22/2024 None Detected  NONE DETECTED (Cut Off Level 300 ng/mL) Final   POC Oxycodone UR 02/22/2024 None Detected  NONE DETECTED (Cut Off Level 100 ng/mL) Final   POC Marijuana UR 02/22/2024 Positive (A)  NONE DETECTED (Cut Off Level 50 ng/mL) Final    Blood Alcohol level:  Lab Results  Component Value Date   ETH <10 03/07/2023   ETH <10 03/05/2023    Metabolic Disorder Labs: Lab Results  Component Value Date   HGBA1C 5.7 (H) 02/22/2024   MPG 116.89 02/22/2024   MPG 108.28 03/05/2023   No results found for: PROLACTIN Lab Results  Component Value Date   CHOL 200 02/22/2024   TRIG 107 02/22/2024   HDL 78 02/22/2024   CHOLHDL 2.6 02/22/2024   VLDL 21 02/22/2024   LDLCALC 101 (H) 02/22/2024   LDLCALC 98 12/16/2022    Therapeutic Lab Levels: No results found for: LITHIUM No results found for: VALPROATE No results found for: CBMZ  Physical Findings   GAD-7    Flowsheet Row Office Visit from 10/27/2022 in  [facility]  Total GAD-7 Score 12   PHQ2-9    Flowsheet Row Office Visit from 10/27/2022 in [facility]  PHQ-2 Total Score 2  PHQ-9 Total Score 8   Flowsheet Row ED from 02/22/2024 in [facility] ED from 03/15/2023 in [facility] Emergency Department at [facility] ED from 03/07/2023 in [facility] Emergency Department at [facility]  C-SSRS RISK CATEGORY No Risk No Risk Error: Q7 should not be populated when Q6 is No     Musculoskeletal  Strength & Muscle Tone: within normal limits Gait & Station: normal Patient leans: N/A  Psychiatric Specialty Exam  Presentation  General Appearance:  Appropriate for Environment  Eye Contact: Fair  Speech: Clear and Coherent  Speech Volume: Normal  Handedness: Right   Mood and Affect  Mood: Euthymic  Affect: Congruent   Thought Process  Thought Processes: Linear  Descriptions of Associations:Intact  Orientation:None  Thought Content:WDL  Diagnosis of Schizophrenia or Schizoaffective disorder in past: No    Hallucinations:Hallucinations: None  Ideas of Reference:None  Suicidal Thoughts:Suicidal Thoughts: No  Homicidal Thoughts:No data recorded  Sensorium  Memory: Immediate Fair  Judgment: Fair  Insight: Fair   Art Therapist  Concentration: Fair  Attention Span: Fair  Recall: [employer] of Knowledge: Fair  Language: Fair   Psychomotor Activity  Psychomotor Activity: Psychomotor Activity: -- (Noted chorea on examination, consistent with patients history of Huntington disease.)   Assets  Assets: Resilience   Sleep  Sleep: Sleep: Good  No Safety Checks orders active in given range  No data recorded  Physical Exam  Physical Exam Vitals and nursing note reviewed.  Cardiovascular:     Rate and Rhythm: Normal rate.  Psychiatric:        Attention and Perception: He does not perceive auditory or visual  hallucinations.        Behavior: Behavior is cooperative.        Thought Content: Thought content does not include homicidal or suicidal ideation. Thought content does not include homicidal or suicidal plan.    Review of Systems  Constitutional:  Negative for chills.  Respiratory:  Negative for cough.   Cardiovascular:  Negative for chest pain and palpitations.  Psychiatric/Behavioral:  Negative for hallucinations and suicidal ideas. The patient  does not have insomnia.    Blood pressure 119/69, pulse 65, temperature 97.9 F (36.6 C), temperature source Oral, resp. rate 20, SpO2 100%. There is no height or weight on file to calculate BMI.  Treatment Plan Summary: Social work will explore temporary placement with a family friend, initiate referrals to appropriate SNF facilities, and consider adult emergency placement options if needed.  Next team meeting scheduled for 03/09/2024 at 10:00 AM.  Disposition: Patient is waiting placement. Patient requires placement in a Skilled Nursing Facility capable of managing Huntingtons disease and high care needs.   Ardelle JONELLE Blush, NP 02/29/2024 10:09 AM

## 2024-02-29 NOTE — ED Notes (Signed)
 Pt observed/assessed in recliner sleeping. RR even and unlabored, appearing in no noted distress. Environmental check complete, will continue to monitor for safety

## 2024-02-29 NOTE — ED Notes (Signed)
 Pt awake. Pt on unit hyperactive, paces back and forth, tremors due to Huntington's disease. Pt high fall risk. no acute changes noted at this time. Pt Alert and oriented. Interacting with peers/staff appropriately. No current suicidal or homicidal ideation reported. No hallucinations or delusions observed. Patient is tolerating the milieu. Pt given breakfast. Environment secured. Safety checks in place per facility protocol

## 2024-03-01 NOTE — ED Notes (Signed)
 Appears resting with equal and unlabored respirations. No appeared or reported distress. Monitor for safety.

## 2024-03-01 NOTE — ED Notes (Signed)
 Patient resting in lounger. Calm, collected, no physical complaints at this time. Patient in no apparent acute distress. No inappropriate behaviors observed or reported at this time. Environment secured. Safety checks in place per facility protocol.

## 2024-03-01 NOTE — ED Notes (Signed)
 Patient assisted with shower, clothes and linens changed and area around bed cleaned.  Patient now resting in bed.

## 2024-03-01 NOTE — ED Provider Notes (Addendum)
 Behavioral Health Progress Note  Date and Time: 03/01/2024 6:24 PM Name: Frank Fernandez MRN:  [id number]  Subjective:  I am ok  Diagnosis:  Final diagnoses:  Huntington's disease (HCC)  Delusions of parasitosis (HCC)  PTSD (post-traumatic stress disorder)   History of Present Illness (HPI): Per chart review, Frank Fernandez is a 44 year old male with a medical history significant for Huntingtons disease and a psychiatric history of stimulant use disorder (cocaine ), post-traumatic stress disorder, and major depressive disorder. He presented to [facility] on 01/28 after being brought in by the [facility]. Documentation from that date indicates the patient was exhibiting aggressive and unsafe behaviors at the residence where he was staying. Chart review further notes that the patient has been under the custody of the Department of Social Services for approximately two weeks, with DSS guardian identified as [name] [phone]), and that guardianship paperwork was expected to be provided the following day.   Assessment - 03/01/2024: The patient remained pleasant, calm, and fully cooperative during the assessment, with no acute concerns reported. He stated that he slept well overnight, endorsed a good appetite, and requested an additional serving of cereal during the interview. He denied symptoms of depression as well as any suicidal or homicidal ideation, and denied auditory or visual hallucinations. His thought processes were logical, organized, and goal oriented. There were no observed safety or behavioral issues at the time of evaluation. Involuntary choreiform movements were noted on exam, consistent with his known diagnosis of Huntingtons disease. The patient remains on the observation unit pending appropriate placement, with no changes noted since the prior assessment. Disposition planning is ongoing. Social work will continue to assess the feasibility of  short-term placement with a family friend, proceed with referrals to suitable skilled nursing facilities, and evaluate adult emergency placement alternatives as indicated. The next interdisciplinary team meeting is scheduled for 03/09/2024 at 10:00 AM.  Total Time spent with patient: 30 minutes    Pain Medications: See MAR Prescriptions: See MAR Over the Counter: See MAR History of alcohol / drug use?: No history of alcohol / drug abuse     Sleep: Good  Appetite:  Good  Current Medications:  Current Facility-Administered Medications  Medication Dose Route Frequency Provider Last Rate Last Admin   acetaminophen  (TYLENOL ) tablet 650 mg  650 mg Oral Q6H PRN Lynnette Barter, MD   650 mg at 02/23/24 0915   alum & mag hydroxide-simeth (MAALOX/MYLANTA) 200-200-20 MG/5ML suspension 30 mL  30 mL Oral Q4H PRN Lynnette Barter, MD       ibuprofen  (ADVIL ) tablet 600 mg  600 mg Oral Q6H PRN Hobson, Fran E, NP   600 mg at 02/27/24 2109   magnesium  hydroxide (MILK OF MAGNESIA) suspension 30 mL  30 mL Oral Daily PRN Lynnette Barter, MD       nicotine  (NICODERM CQ  - dosed in mg/24 hours) patch 21 mg  21 mg Transdermal Daily Cole Kandi BROCKS, MD   21 mg at 03/01/24 9148   OLANZapine  (ZYPREXA ) injection 5 mg  5 mg Intramuscular TID PRN Lynnette Barter, MD       OLANZapine  zydis (ZYPREXA ) disintegrating tablet 5 mg  5 mg Oral TID PRN Lynnette Barter, MD   5 mg at 02/24/24 2335   OLANZapine  zydis (ZYPREXA ) disintegrating tablet 5 mg  5 mg Oral BID Cole Kandi BROCKS, MD   5 mg at 03/01/24 9148   traZODone  (DESYREL ) tablet 50 mg  50 mg Oral QHS PRN Lynnette Barter, MD  50 mg at 02/28/24 2121   valbenazine  (INGREZZA ) capsule 40 mg  40 mg Oral Daily Ji, Andrew, MD   40 mg at 03/01/24 1047   No current outpatient medications on file.    Labs  Lab Results:  Admission on 02/22/2024  Component Date Value Ref Range Status   WBC 02/22/2024 11.9 (H)  4.0 - 10.5 K/uL Final   RBC 02/22/2024 4.79  4.22 - 5.81 MIL/uL Final   Hemoglobin  02/22/2024 15.2  13.0 - 17.0 g/dL Final   HCT 98/71/7973 45.3  39.0 - 52.0 % Final   MCV 02/22/2024 94.6  80.0 - 100.0 fL Final   MCH 02/22/2024 31.7  26.0 - 34.0 pg Final   MCHC 02/22/2024 33.6  30.0 - 36.0 g/dL Final   RDW 98/71/7973 13.0  11.5 - 15.5 % Final   Platelets 02/22/2024 207  150 - 400 K/uL Final   nRBC 02/22/2024 0.0  0.0 - 0.2 % Final   Neutrophils Relative % 02/22/2024 86  % Final   Neutro Abs 02/22/2024 10.2 (H)  1.7 - 7.7 K/uL Final   Lymphocytes Relative 02/22/2024 10  % Final   Lymphs Abs 02/22/2024 1.1  0.7 - 4.0 K/uL Final   Monocytes Relative 02/22/2024 4  % Final   Monocytes Absolute 02/22/2024 0.4  0.1 - 1.0 K/uL Final   Eosinophils Relative 02/22/2024 0  % Final   Eosinophils Absolute 02/22/2024 0.0  0.0 - 0.5 K/uL Final   Basophils Relative 02/22/2024 0  % Final   Basophils Absolute 02/22/2024 0.0  0.0 - 0.1 K/uL Final   Immature Granulocytes 02/22/2024 0  % Final   Abs Immature Granulocytes 02/22/2024 0.04  0.00 - 0.07 K/uL Final   Performed at [facility], [address], [place], [place] [address]   Hgb A1c MFr Bld 02/22/2024 5.7 (H)  4.8 - 5.6 % Final   Comment: (NOTE) Diagnosis of Diabetes The following HbA1c ranges recommended by the American Diabetes Association (ADA) may be used as an aid in the diagnosis of diabetes mellitus.  Hemoglobin             Suggested A1C NGSP%              Diagnosis  <5.7                   Non Diabetic  5.7-6.4                Pre-Diabetic  >6.4                   Diabetic  <7.0                   Glycemic control for                       adults with diabetes.     Mean Plasma Glucose 02/22/2024 116.89  mg/dL Final   Performed at [facility], [address], [place], [place] [address]   Sodium 02/22/2024 144  135 - 145 mmol/L Final   Potassium 02/22/2024 3.8  3.5 - 5.1 mmol/L Final   Chloride 02/22/2024 106  98 - 111 mmol/L Final   CO2 02/22/2024 27  22 - 32 mmol/L Final   Glucose, Bld 02/22/2024  90  70 - 99 mg/dL Final   Glucose reference range applies only to samples taken after fasting for at least 8 hours.   BUN 02/22/2024 25 (  H)  6 - 20 mg/dL Final   Creatinine, Ser 02/22/2024 0.96  0.61 - 1.24 mg/dL Final   Calcium 98/71/7973 9.1  8.9 - 10.3 mg/dL Final   Total Protein 98/71/7973 7.2  6.5 - 8.1 g/dL Final   Albumin 98/71/7973 4.3  3.5 - 5.0 g/dL Final   AST 98/71/7973 19  15 - 41 U/L Final   ALT 02/22/2024 21  0 - 44 U/L Final   Alkaline Phosphatase 02/22/2024 82  38 - 126 U/L Final   Total Bilirubin 02/22/2024 0.4  0.0 - 1.2 mg/dL Final   GFR, Estimated 02/22/2024 >60  >60 mL/min Final   Comment: (NOTE) Calculated using the CKD-EPI Creatinine Equation (2021)    Anion gap 02/22/2024 12  5 - 15 Final   Performed at [facility], [address], [place], [place] [address]   Cholesterol 02/22/2024 200  0 - 200 mg/dL Final   Comment:        ATP III CLASSIFICATION:  <200     mg/dL   Desirable  799-760  mg/dL   Borderline High  >=759    mg/dL   High           Triglycerides 02/22/2024 107  <150 mg/dL Final   HDL 98/71/7973 78  >40 mg/dL Final   Total CHOL/HDL Ratio 02/22/2024 2.6  RATIO Final   VLDL 02/22/2024 21  0 - 40 mg/dL Final   LDL Cholesterol 02/22/2024 101 (H)  0 - 99 mg/dL Final   Comment:        Total Cholesterol/HDL:CHD Risk Coronary Heart Disease Risk Table                     Men   Women  1/2 Average Risk   3.4   3.3  Average Risk       5.0   4.4  2 X Average Risk   9.6   7.1  3 X Average Risk  23.4   11.0        Use the calculated Patient Ratio above and the CHD Risk Table to determine the patient's CHD Risk.        ATP III CLASSIFICATION (LDL):  <100     mg/dL   Optimal  899-870  mg/dL   Near or Above                    Optimal  130-159  mg/dL   Borderline  839-810  mg/dL   High  >809     mg/dL   Very High Performed at [facility], [address], [place], [place] [address]    POC Amphetamine UR 02/22/2024 None Detected  NONE  DETECTED (Cut Off Level 1000 ng/mL) Final   POC Secobarbital (BAR) 02/22/2024 None Detected  NONE DETECTED (Cut Off Level 300 ng/mL) Final   POC Buprenorphine (BUP) 02/22/2024 None Detected  NONE DETECTED (Cut Off Level 10 ng/mL) Final   POC Oxazepam (BZO) 02/22/2024 None Detected  NONE DETECTED (Cut Off Level 300 ng/mL) Final   POC Cocaine  UR 02/22/2024 None Detected  NONE DETECTED (Cut Off Level 300 ng/mL) Final   POC Methamphetamine UR 02/22/2024 None Detected  NONE DETECTED (Cut Off Level 1000 ng/mL) Final   POC Morphine 02/22/2024 None Detected  NONE DETECTED (Cut Off Level 300 ng/mL) Final   POC Methadone UR 02/22/2024 None Detected  NONE DETECTED (Cut Off Level 300 ng/mL) Final   POC Oxycodone UR 02/22/2024 None  Detected  NONE DETECTED (Cut Off Level 100 ng/mL) Final   POC Marijuana UR 02/22/2024 Positive (A)  NONE DETECTED (Cut Off Level 50 ng/mL) Final    Blood Alcohol level:  Lab Results  Component Value Date   ETH <10 03/07/2023   ETH <10 03/05/2023    Metabolic Disorder Labs: Lab Results  Component Value Date   HGBA1C 5.7 (H) 02/22/2024   MPG 116.89 02/22/2024   MPG 108.28 03/05/2023   No results found for: PROLACTIN Lab Results  Component Value Date   CHOL 200 02/22/2024   TRIG 107 02/22/2024   HDL 78 02/22/2024   CHOLHDL 2.6 02/22/2024   VLDL 21 02/22/2024   LDLCALC 101 (H) 02/22/2024   LDLCALC 98 12/16/2022    Therapeutic Lab Levels: No results found for: LITHIUM No results found for: VALPROATE No results found for: CBMZ  Physical Findings   GAD-7    Flowsheet Row Office Visit from 10/27/2022 in [facility]  Total GAD-7 Score 12   PHQ2-9    Flowsheet Row Office Visit from 10/27/2022 in [facility]  PHQ-2 Total Score 2  PHQ-9 Total Score 8   Flowsheet Row ED from 02/22/2024 in [facility] ED from 03/15/2023 in [facility] Emergency Department at [facility] ED from 03/07/2023 in  [facility] Emergency Department at [facility]  C-SSRS RISK CATEGORY No Risk No Risk Error: Q7 should not be populated when Q6 is No     Musculoskeletal  Strength & Muscle Tone: within normal limits Gait & Station: normal Patient leans: N/A  Psychiatric Specialty Exam  Presentation  General Appearance:  Appropriate for Environment  Eye Contact: Fair  Speech: Clear and Coherent  Speech Volume: Normal  Handedness: Right   Mood and Affect  Mood: Euthymic  Affect: Congruent   Thought Process  Thought Processes: Linear  Descriptions of Associations:Intact  Orientation:None  Thought Content:WDL  Diagnosis of Schizophrenia or Schizoaffective disorder in past: No    Hallucinations:Hallucinations: None  Ideas of Reference:None  Suicidal Thoughts:Suicidal Thoughts: No  Homicidal Thoughts:No data recorded  Sensorium  Memory: Immediate Fair  Judgment: Fair  Insight: Fair   Art Therapist  Concentration: Fair  Attention Span: Fair  Recall: [employer] of Knowledge: Fair  Language: Fair   Psychomotor Activity  Psychomotor Activity: Psychomotor Activity: -- (Noted chorea on examination, consistent with patients history of Huntington disease.)   Assets  Assets: Resilience   Sleep  Sleep: Sleep: Good  No Safety Checks orders active in given range  No data recorded  Physical Exam  Physical Exam Vitals and nursing note reviewed.  Cardiovascular:     Rate and Rhythm: Normal rate.  Neurological:     Mental Status: He is alert.    Review of Systems  Psychiatric/Behavioral:  Negative for hallucinations and suicidal ideas.    Blood pressure (!) 143/91, pulse (!) 103, temperature 98.2 F (36.8 C), temperature source Oral, resp. rate 16, SpO2 100%. There is no height or weight on file to calculate BMI.  Treatment Plan Summary: The patient remains on the observation unit pending appropriate placement, with no changes  noted since the prior assessment. Disposition planning is ongoing. Social work will continue to assess the feasibility of short-term placement with a family friend, proceed with referrals to suitable skilled nursing facilities, and evaluate adult emergency placement alternatives as indicated. The next interdisciplinary team meeting is scheduled for 03/09/2024 at 10:00 AM.  The patient continues to be psychiatrically cleared and  clinically stable, with no new or worsening psychiatric symptoms noted since the prior assessment. He remains hospitalized solely due to ongoing placement needs.  Ardelle JONELLE Blush, NP 03/01/2024 6:24 PM

## 2024-03-01 NOTE — ED Notes (Signed)
 Patient up on unit watching TV. Compliant with sitting in chair while doing so and taking medication. Able to make needs known. No reported or appeared distress. Monitor for safety.

## 2024-03-02 NOTE — ED Notes (Signed)
 Patient awake sitting up in chair. No acute distress noted. No concerns voiced. Informed pt to notify staff with any needs or assistance. Pt verbalized understanding and agreement. Will continue to monitor for safety.
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
## 2024-03-02 NOTE — ED Notes (Signed)
 Patient appears sleeping with equal and unlabored respirations. No appeared or reported distress. Monitor for safety.

## 2024-03-02 NOTE — ED Notes (Signed)
 Pt sitting in dayroom watching television and interacting with peers. No acute distress noted. No concerns voiced. Informed pt to notify staff with any needs or assistance. Pt verbalized understanding and agreement. Will continue to monitor for safety.
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
## 2024-03-02 NOTE — ED Notes (Signed)
 Pt is quietly sitting in the milieu with no distress noted. Snack has been provided.

## 2024-03-02 NOTE — Group Note (Unsigned)
 Group Topic: Balance in Life  Group Date: 03/02/2024 Start Time: 1230 End Time: 1250 Facilitators: Deidre Prentis CROME, NT  Department: [facility]  Number of Participants: 5  Group Focus: affirmation Treatment Modality:  Psychoeducation Interventions utilized were support Purpose: reinforce self-care   Name: Frank Fernandez Date of Birth: 09-Oct-1980  MR: [id number]    Level of Participation: {THERAPIES; PSYCH GROUP PARTICIPATION OZCZO:76008} Quality of Participation: {THERAPIES; PSYCH QUALITY OF PARTICIPATION:23992} Interactions with others: {THERAPIES; PSYCH INTERACTIONS:23993} Mood/Affect: {THERAPIES; PSYCH MOOD/AFFECT:23994} Triggers (if applicable): *** Cognition: {THERAPIES; PSYCH COGNITION:23995} Progress: {THERAPIES; PSYCH PROGRESS:23997} Response: *** Plan: {THERAPIES; PSYCH EOJW:76003}  Patients Problems:  Patient Active Problem List   Diagnosis Date Noted   Insomnia 03/28/2023   Delusions of parasitosis (HCC) 03/15/2023   Chronic pain of right lower extremity 10/27/2022   Huntington's disease (HCC) 05/12/2021   Chorea 04/29/2021   Depression 04/29/2021   Total body pain 04/29/2021
